# Patient Record
Sex: Female | Born: 1985 | Race: Black or African American | Hispanic: No | Marital: Single | Smoking: Current every day smoker
Health system: Southern US, Community
[De-identification: ages and names within clinical notes are randomized; demographics above are authoritative.]

## PROBLEM LIST (undated history)

## (undated) ENCOUNTER — Ambulatory Visit: Admission: EM | Payer: 59 | Source: Home / Self Care

## (undated) ENCOUNTER — Ambulatory Visit: Admission: EM | Payer: 59

## (undated) DIAGNOSIS — F419 Anxiety disorder, unspecified: Secondary | ICD-10-CM

## (undated) DIAGNOSIS — K219 Gastro-esophageal reflux disease without esophagitis: Secondary | ICD-10-CM

## (undated) DIAGNOSIS — T7840XA Allergy, unspecified, initial encounter: Secondary | ICD-10-CM

## (undated) DIAGNOSIS — I10 Essential (primary) hypertension: Secondary | ICD-10-CM

## (undated) HISTORY — DX: Gastro-esophageal reflux disease without esophagitis: K21.9

## (undated) HISTORY — DX: Allergy, unspecified, initial encounter: T78.40XA

## (undated) HISTORY — DX: Essential (primary) hypertension: I10

## (undated) HISTORY — DX: Anxiety disorder, unspecified: F41.9

---
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
## 2005-06-02 ENCOUNTER — Emergency Department (HOSPITAL_COMMUNITY): Admission: EM | Admit: 2005-06-02 | Discharge: 2005-06-02 | Payer: Self-pay | Admitting: Emergency Medicine

## 2005-07-19 ENCOUNTER — Emergency Department (HOSPITAL_COMMUNITY): Admission: EM | Admit: 2005-07-19 | Discharge: 2005-07-19 | Payer: Self-pay | Admitting: Emergency Medicine

## 2005-07-31 ENCOUNTER — Emergency Department (HOSPITAL_COMMUNITY): Admission: EM | Admit: 2005-07-31 | Discharge: 2005-07-31 | Payer: Self-pay | Admitting: *Deleted

## 2006-10-24 ENCOUNTER — Emergency Department (HOSPITAL_COMMUNITY): Admission: EM | Admit: 2006-10-24 | Discharge: 2006-10-24 | Payer: Self-pay | Admitting: Emergency Medicine

## 2008-02-21 ENCOUNTER — Emergency Department (HOSPITAL_COMMUNITY): Admission: EM | Admit: 2008-02-21 | Discharge: 2008-02-21 | Payer: Self-pay | Admitting: Emergency Medicine

## 2008-11-08 ENCOUNTER — Emergency Department (HOSPITAL_COMMUNITY): Admission: EM | Admit: 2008-11-08 | Discharge: 2008-11-08 | Payer: Self-pay | Admitting: Emergency Medicine

## 2009-09-06 ENCOUNTER — Emergency Department (HOSPITAL_COMMUNITY): Admission: EM | Admit: 2009-09-06 | Discharge: 2009-09-06 | Payer: Self-pay | Admitting: Emergency Medicine

## 2010-05-02 ENCOUNTER — Emergency Department (HOSPITAL_COMMUNITY)
Admission: EM | Admit: 2010-05-02 | Discharge: 2010-05-02 | Payer: Self-pay | Source: Home / Self Care | Admitting: Emergency Medicine

## 2010-07-08 ENCOUNTER — Emergency Department (HOSPITAL_COMMUNITY): Payer: Self-pay

## 2010-07-08 ENCOUNTER — Emergency Department (HOSPITAL_COMMUNITY)
Admission: EM | Admit: 2010-07-08 | Discharge: 2010-07-09 | Disposition: A | Discharge status: Home / Self Care | Payer: Self-pay | Attending: Emergency Medicine | Admitting: Emergency Medicine

## 2010-07-08 DIAGNOSIS — R0609 Other forms of dyspnea: Secondary | ICD-10-CM | POA: Insufficient documentation

## 2010-07-08 DIAGNOSIS — R0602 Shortness of breath: Secondary | ICD-10-CM | POA: Insufficient documentation

## 2010-07-08 DIAGNOSIS — R0789 Other chest pain: Secondary | ICD-10-CM | POA: Insufficient documentation

## 2010-07-08 DIAGNOSIS — J45909 Unspecified asthma, uncomplicated: Secondary | ICD-10-CM | POA: Insufficient documentation

## 2010-07-08 DIAGNOSIS — R0989 Other specified symptoms and signs involving the circulatory and respiratory systems: Secondary | ICD-10-CM | POA: Insufficient documentation

## 2010-07-08 LAB — URINALYSIS, ROUTINE W REFLEX MICROSCOPIC
Bilirubin Urine: NEGATIVE
Glucose, UA: NEGATIVE mg/dL
Hgb urine dipstick: NEGATIVE
Ketones, ur: NEGATIVE mg/dL
Nitrite: NEGATIVE
Protein, ur: NEGATIVE mg/dL
Specific Gravity, Urine: 1.028 (ref 1.005–1.030)
Urobilinogen, UA: 1 mg/dL (ref 0.0–1.0)
pH: 7 (ref 5.0–8.0)

## 2010-07-08 LAB — CBC
RBC: 4.73 MIL/uL (ref 3.87–5.11)
WBC: 6.1 10*3/uL (ref 4.0–10.5)

## 2010-07-08 LAB — COMPREHENSIVE METABOLIC PANEL
ALT: 33 U/L (ref 0–35)
AST: 34 U/L (ref 0–37)
Alkaline Phosphatase: 70 U/L (ref 39–117)
CO2: 27 mEq/L (ref 19–32)
Calcium: 9 mg/dL (ref 8.4–10.5)
Chloride: 101 mEq/L (ref 96–112)
GFR calc Af Amer: >60 mL/min (ref >60)
GFR calc non Af Amer: >60 mL/min (ref >60)
Potassium: 3.6 mEq/L (ref 3.5–5.1)
Sodium: 135 mEq/L (ref 135–145)
Total Bilirubin: 0.5 mg/dL (ref 0.3–1.2)

## 2010-07-08 LAB — DIFFERENTIAL
Basophils Absolute: 0 10*3/uL (ref 0.0–0.1)
Basophils Relative: 1 % (ref 0–1)
Lymphocytes Relative: 42 % (ref 12–46)
Monocytes Absolute: 0.5 10*3/uL (ref 0.1–1.0)
Monocytes Relative: 8 % (ref 3–12)
Neutro Abs: 2.8 10*3/uL (ref 1.7–7.7)

## 2010-07-08 LAB — URINE MICROSCOPIC-ADD ON

## 2010-07-08 LAB — GC/CHLAMYDIA PROBE AMP, GENITAL: Chlamydia, DNA Probe: NEGATIVE

## 2010-07-08 LAB — WET PREP, GENITAL
Trich, Wet Prep: NONE SEEN
Yeast Wet Prep HPF POC: NONE SEEN

## 2010-07-08 LAB — POCT PREGNANCY, URINE: Preg Test, Ur: NEGATIVE

## 2011-07-13 ENCOUNTER — Emergency Department (HOSPITAL_COMMUNITY)
Admission: EM | Admit: 2011-07-13 | Discharge: 2011-07-13 | Disposition: A | Discharge status: Home / Self Care | Payer: Self-pay | Source: Home / Self Care | Attending: Emergency Medicine | Admitting: Emergency Medicine

## 2011-07-13 ENCOUNTER — Encounter (HOSPITAL_COMMUNITY): Payer: Self-pay

## 2011-07-13 DIAGNOSIS — J329 Chronic sinusitis, unspecified: Secondary | ICD-10-CM

## 2011-07-13 DIAGNOSIS — J3489 Other specified disorders of nose and nasal sinuses: Secondary | ICD-10-CM

## 2011-07-13 MED ORDER — ACETAMINOPHEN-CODEINE #3 300-30 MG PO TABS: 1.0000 | ORAL_TABLET | ORAL | Status: AC | PRN

## 2011-07-13 MED ORDER — SODIUM CHLORIDE 0.65 % NA SOLN: 1.0000 | NASAL | Status: DC | PRN

## 2011-07-13 MED ORDER — FEXOFENADINE-PSEUDOEPHED ER 60-120 MG PO TB12: 1.0000 | ORAL_TABLET | Freq: Two times a day (BID) | ORAL | Status: AC

## 2011-07-13 NOTE — ED Notes (Signed)
C/o sinus /head congestion, pressure on lt side of head, and runny nose for 2 days.  Thinks she has a sinus infection.

## 2011-07-13 NOTE — Discharge Instructions (Signed)
  Has discuss her constant nasal and runny nose along with her sinus pressure are consistent with a viral upper respiratory process. Use this medicines as prescribed and hydrate yourself well as well as a humidifier usage at night. If symptoms were to persist beyond 7-10 days return for recheck   Sinusitis Sinuses are air pockets within the bones of your face. The growth of bacteria within a sinus leads to infection. The infection prevents the sinuses from draining. This infection is called sinusitis. SYMPTOMS  There will be different areas of pain depending on which sinuses have become infected.  The maxillary sinuses often produce pain beneath the eyes.   Frontal sinusitis may cause pain in the middle of the forehead and above the eyes.  Other problems (symptoms) include:  Toothaches.   Colored, pus-like (purulent) drainage from the nose.   Swelling, warmth, and tenderness over the sinus areas may be signs of infection.  TREATMENT  Sinusitis is most often determined by an exam.X-rays may be taken. If x-rays have been taken, make sure you obtain your results or find out how you are to obtain them. Your caregiver may give you medications (antibiotics). These are medications that will help kill the bacteria causing the infection. You may also be given a medication (decongestant) that helps to reduce sinus swelling.  HOME CARE INSTRUCTIONS   Only take over-the-counter or prescription medicines for pain, discomfort, or fever as directed by your caregiver.   Drink extra fluids. Fluids help thin the mucus so your sinuses can drain more easily.   Applying either moist heat or ice packs to the sinus areas may help relieve discomfort.   Use saline nasal sprays to help moisten your sinuses. The sprays can be found at your local drugstore.  SEEK IMMEDIATE MEDICAL CARE IF:  You have a fever.   You have increasing pain, severe headaches, or toothache.   You have nausea, vomiting, or  drowsiness.   You develop unusual swelling around the face or trouble seeing.  MAKE SURE YOU:   Understand these instructions.   Will watch your condition.   Will get help right away if you are not doing well or get worse.  Document Released: 03/19/2005 Document Revised: 03/08/2011 Document Reviewed: 10/16/2006 [facility] Patient Information 2012 [place], [place].

## 2011-07-13 NOTE — ED Provider Notes (Signed)
History     CSN: [id number]  Arrival date & time 07/13/11  1404   First MD Initiated Contact with Patient 07/13/11 1405      Chief Complaint  Patient presents with  . Sinusitis    (Consider location/radiation/quality/duration/timing/severity/associated sxs/prior treatment) HPI Comments: Patient presents urgent care complaining of sinus congestion, stuffy and runny nose with some postnasal dripping and sinus pressure mainly on the left side of her head (points to left frontal and maxillary regions) patient describes that she has a cousin at home but was diagnosed last with an upper respiratory infection. He has been using Zyrtec and also normal saline spray as she has a lot of runny and congested nose don't seem to be helping her much.  Patient denies any fevers or shortness of breath and occasionally cough is induced by postnasal dripping  Patient is a 26 y.o. female presenting with sinusitis. The history is provided by the patient.  Sinusitis  This is a new problem. The current episode started 2 days ago. The problem has not changed since onset.There has been no fever. Associated symptoms include congestion, ear pain, sinus pressure, sore throat and cough. Pertinent negatives include no swollen glands and no shortness of breath. Treatments tried: zyrtec-saline nasal spray. The treatment provided no relief.    Past Medical History  Diagnosis Date  . Asthma     History reviewed. No pertinent past surgical history.  No family history on file.  History  Substance Use Topics  . Smoking status: Current Everyday Smoker -- 1.0 packs/day  . Smokeless tobacco: Not on file  . Alcohol Use: No    OB History    Grav Para Term Preterm Abortions TAB SAB Ect Mult Living                  Review of Systems  Constitutional: Negative for fever and appetite change.  HENT: Positive for ear pain, congestion, sore throat, rhinorrhea, postnasal drip and sinus pressure.   Respiratory: Positive  for cough. Negative for shortness of breath, wheezing and stridor.   Skin: Negative for rash.    Allergies  Penicillins  Home Medications   Current Outpatient Rx  Name Route Sig Dispense Refill  . CETIRIZINE HCL 10 MG PO TABS Oral Take 10 mg by mouth daily.      BP 127/92  Pulse 111  Temp(Src) 98.2 F (36.8 C) (Oral)  Resp 18  SpO2 99%  LMP 07/02/2011  Physical Exam  Nursing note and vitals reviewed. Constitutional: Vital signs are normal.  Non-toxic appearance. She does not have a sickly appearance. She does not appear ill. No distress.  HENT:  Head: Normocephalic.  Right Ear: Tympanic membrane normal. No mastoid tenderness. Tympanic membrane is not injected. No middle ear effusion. No hemotympanum.  Left Ear: Tympanic membrane normal. No mastoid tenderness. Tympanic membrane is not injected.  No middle ear effusion. No hemotympanum.  Nose: Rhinorrhea present.  Mouth/Throat: Uvula is midline and mucous membranes are normal. Posterior oropharyngeal erythema present. No oropharyngeal exudate.  Eyes: Conjunctivae are normal. Right eye exhibits no discharge. Left eye exhibits no discharge.  Neck: Trachea normal. Neck supple. No JVD present. No erythema present.  Pulmonary/Chest: Effort normal and breath sounds normal. No respiratory distress. She has no decreased breath sounds. She has no wheezes.  Lymphadenopathy:    She has no cervical adenopathy.  Neurological: She is alert.  Skin: Skin is warm. No rash noted. No erythema. No pallor.    ED Course  Procedures (  including critical care time)  Labs Reviewed - No data to display No results found.   No diagnosis found.    MDM  Sinus congestion with marked rhinorrhea. Afebrile no respiratory distress for 48 hours. Encouraged patient to take antihistamine along with the congestion as prescribed continue normal saline sprays and codeine prescribed for pressure and discomfort. To return in 7-10 days if symptoms were to  persist or worsens        Jimmie Molly, MD 07/13/11 1450

## 2012-07-05 ENCOUNTER — Encounter (HOSPITAL_COMMUNITY): Payer: Self-pay | Admitting: Emergency Medicine

## 2012-07-05 ENCOUNTER — Emergency Department (INDEPENDENT_AMBULATORY_CARE_PROVIDER_SITE_OTHER)
Admission: EM | Admit: 2012-07-05 | Discharge: 2012-07-05 | Disposition: A | Discharge status: Home / Self Care | Payer: 59 | Source: Home / Self Care | Attending: Emergency Medicine | Admitting: Emergency Medicine

## 2012-07-05 DIAGNOSIS — J40 Bronchitis, not specified as acute or chronic: Secondary | ICD-10-CM

## 2012-07-05 MED ORDER — AZITHROMYCIN 250 MG PO TABS: ORAL_TABLET | ORAL | Status: DC

## 2012-07-05 MED ORDER — HYDROCOD POLST-CHLORPHEN POLST 10-8 MG/5ML PO LQCR: 5.0000 mL | Freq: Two times a day (BID) | ORAL | Status: DC | PRN

## 2012-07-05 MED ORDER — ALBUTEROL SULFATE HFA 108 (90 BASE) MCG/ACT IN AERS: 1.0000 | INHALATION_SPRAY | RESPIRATORY_TRACT | Status: DC | PRN

## 2012-07-05 NOTE — ED Provider Notes (Signed)
History     CSN: [id number]  Arrival date & time 07/05/12  1328   First MD Initiated Contact with Patient 07/05/12 1414      Chief Complaint  Patient presents with  . Facial Pain    sinus congestion. productive cough with green mucus. sob. wheezing     Patient is a 27 y.o. female presenting with cough. The history is provided by the patient.  Cough Cough characteristics:  Productive Sputum characteristics:  Green Severity:  Moderate Onset quality:  Gradual Duration:  8 days Timing:  Intermittent Progression:  Unchanged Chronicity:  New Smoker: yes   Context: smoke exposure and upper respiratory infection   Context: not animal exposure, not exposure to allergens, not fumes, not occupational exposure, not sick contacts, not weather changes and not with activity   Relieved by:  Beta-agonist inhaler Worsened by:  Lying down Associated symptoms: rhinorrhea, sinus congestion and sore throat   Associated symptoms: no chest pain, no chills, no diaphoresis, no ear fullness, no ear pain, no eye discharge, no fever, no headaches, no myalgias, no rash, no shortness of breath, no weight loss and no wheezing   Risk factors: no chemical exposure     Past Medical History  Diagnosis Date  . Asthma     History reviewed. No pertinent past surgical history.  History reviewed. No pertinent family history.  History  Substance Use Topics  . Smoking status: Current Every Day Smoker -- 1.00 packs/day  . Smokeless tobacco: Not on file  . Alcohol Use: No    OB History   Grav Para Term Preterm Abortions TAB SAB Ect Mult Living                  Review of Systems  Constitutional: Negative for fever, chills, weight loss and diaphoresis.  HENT: Positive for sore throat and rhinorrhea. Negative for ear pain.   Eyes: Negative.  Negative for discharge.  Respiratory: Positive for cough. Negative for shortness of breath and wheezing.   Cardiovascular: Negative for chest pain.   Gastrointestinal: Negative for nausea, vomiting, abdominal pain and diarrhea.  Endocrine: Negative.   Genitourinary: Negative.   Musculoskeletal: Negative.  Negative for myalgias.  Skin: Negative.  Negative for rash.  Allergic/Immunologic: Negative.   Neurological: Negative.  Negative for headaches.  Hematological: Negative.   Psychiatric/Behavioral: Negative.   Pt reports onset of sore throat and cough almost 2 weeks ago. The sore throat has improved but the cough persist and is worse at night after lying down. Pt has a h/o asthma and states usually she only uses her inhaler when needed but has been using more often over the last 2 weeks since onset of symptoms and provided temporary relief from coughing. Denies CP, SOB, fever, N/V/D or abd pain. Pt is a smoker.  Allergies  Penicillins  Home Medications   Current Outpatient Rx  Name  Route  Sig  Dispense  Refill  . fexofenadine-pseudoephedrine (ALLEGRA-D) 60-120 MG per tablet   Oral   Take 1 tablet by mouth every 12 (twelve) hours.   30 tablet   0   . sodium chloride (OCEAN) 0.65 % nasal spray   Nasal   Place 1 spray into the nose as needed for congestion.   15 mL   12     BP 123/65  Pulse 87  Temp(Src) 97.9 F (36.6 C)  Resp 16  SpO2 100%  LMP 06/27/2012  Physical Exam  Constitutional: She is oriented to person, place, and time. She  appears well-developed and well-nourished.  HENT:  Head: Normocephalic and atraumatic.  Right Ear: Tympanic membrane, external ear and ear canal normal.  Left Ear: Tympanic membrane, external ear and ear canal normal.  Nose: Nose normal. Right sinus exhibits no maxillary sinus tenderness and no frontal sinus tenderness. Left sinus exhibits no maxillary sinus tenderness and no frontal sinus tenderness.  Mouth/Throat: Uvula is midline.  Mild pharyngeal erythema   Eyes: Conjunctivae are normal.  Neck: Neck supple.  Cardiovascular: Normal rate and regular rhythm.   Pulmonary/Chest:  Effort normal and breath sounds normal.  Musculoskeletal: Normal range of motion.  Neurological: She is alert and oriented to person, place, and time.  Skin: Skin is warm and dry.  Psychiatric: She has a normal mood and affect.    ED Course  Procedures (including critical care time)  Labs Reviewed - No data to display No results found.   No diagnosis found.    MDM  Almost 2 weeks h/o persistent cough. Began with sore throat. States cough is frequently productive of greenish secretions and is worse at night. Some relief of cough w/ inhaler. Denies any fever. Will treat  For Bronchitis w/ Z-Pack, short course of tussionex for nocturnal cough and refill for albuterol inhaler. Pt encouraged to f/u here or w/ PCP at [facility] if not improving or to ED if symptoms suddenly worsen. Pt agreeable.        Leanne Chang, NP 07/05/12 1731

## 2012-07-05 NOTE — ED Notes (Signed)
Reports: sinus congestion. Productive cough with green mucus. Sob. Wheezing.  Pt states that she has a hx of asthma and has been using inhaler with mild relief.  Denies fever, n/v/d. Has taken otc sinus meds with no relief

## 2012-07-05 NOTE — ED Provider Notes (Signed)
Medical screening examination/treatment/procedure(s) were performed by non-physician practitioner and as supervising physician I was immediately available for consultation/collaboration.  Leslee Home, M.D.  Reuben Likes, MD 07/05/12 [phone]

## 2012-11-11 IMAGING — CR DG CHEST 2V
2 series · 2 of 2 positions shown · non-contrast
Comparison: Chest radiograph performed 09/06/2009

CLINICAL DATA: Sternal chest pain and shortness of breath; history
of asthma and bronchitis.  History of smoking.

CHEST - 2 VIEW

[w chest pa]
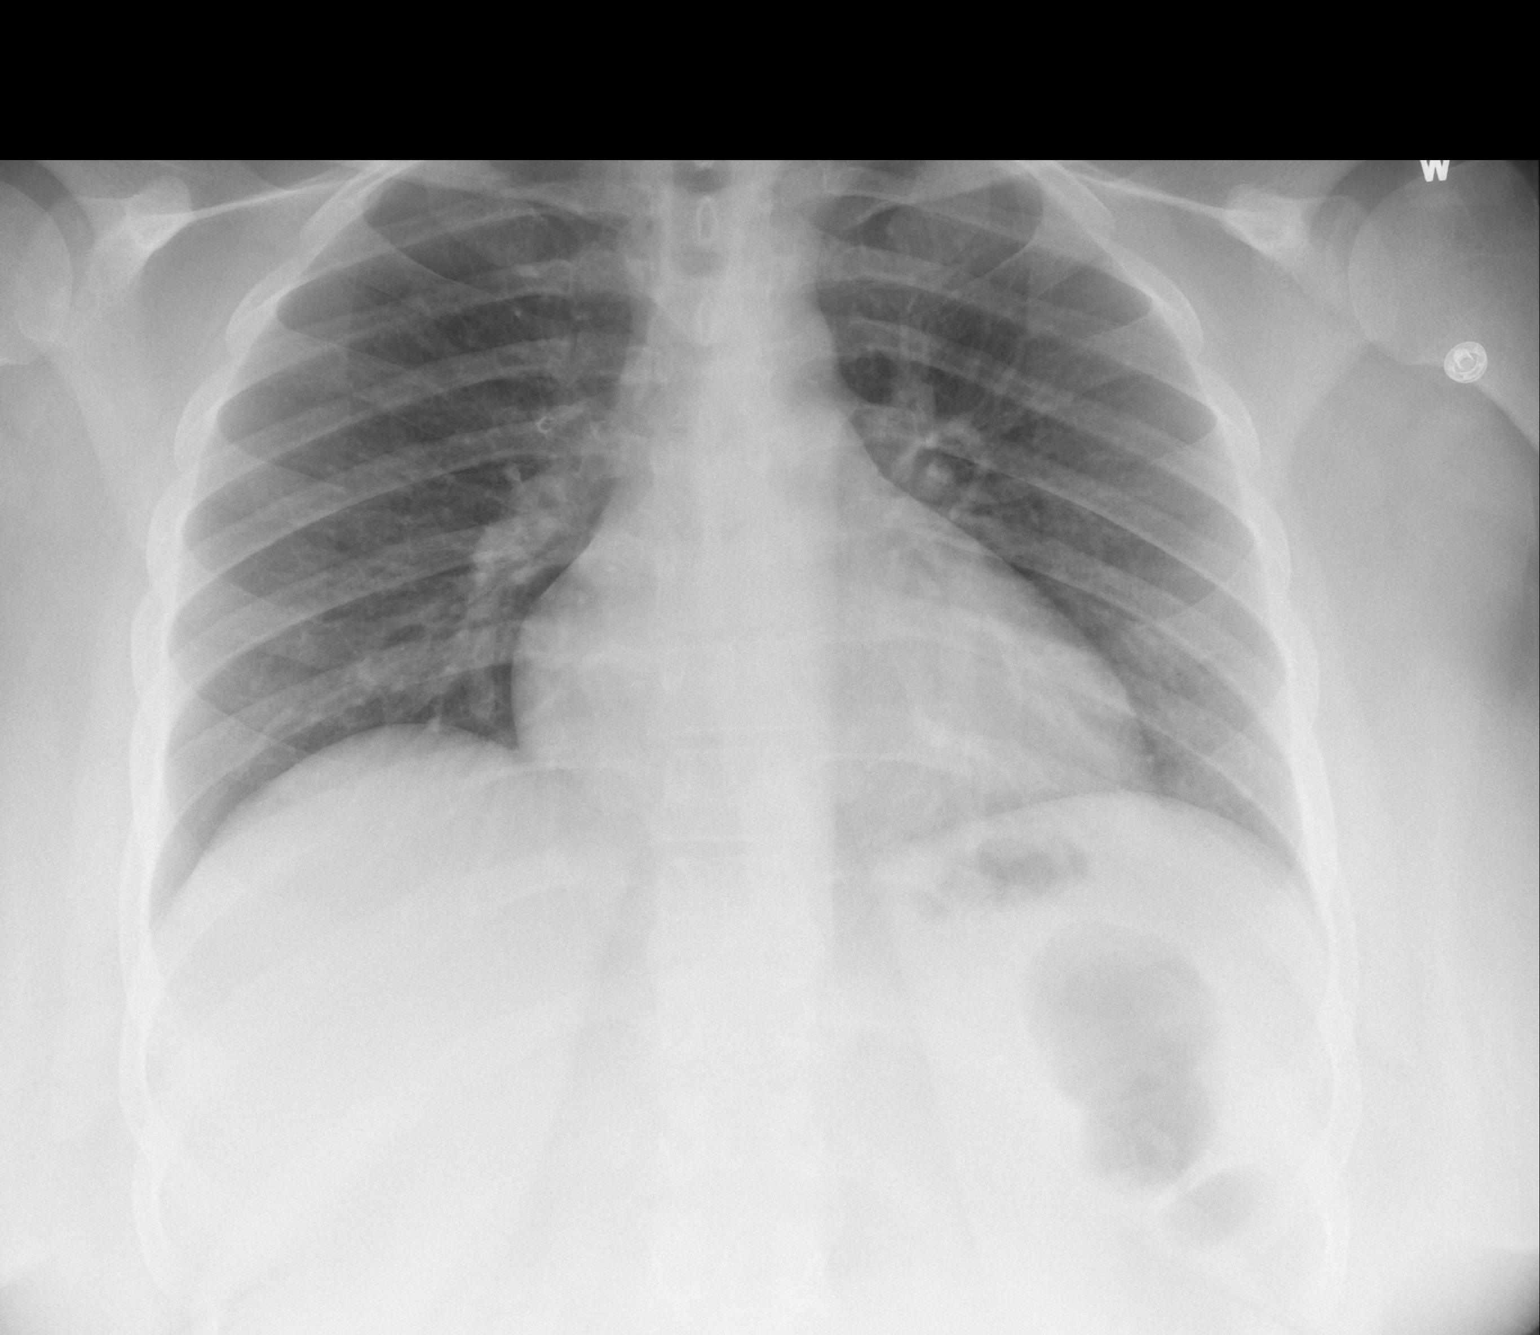

[w chest lat *]
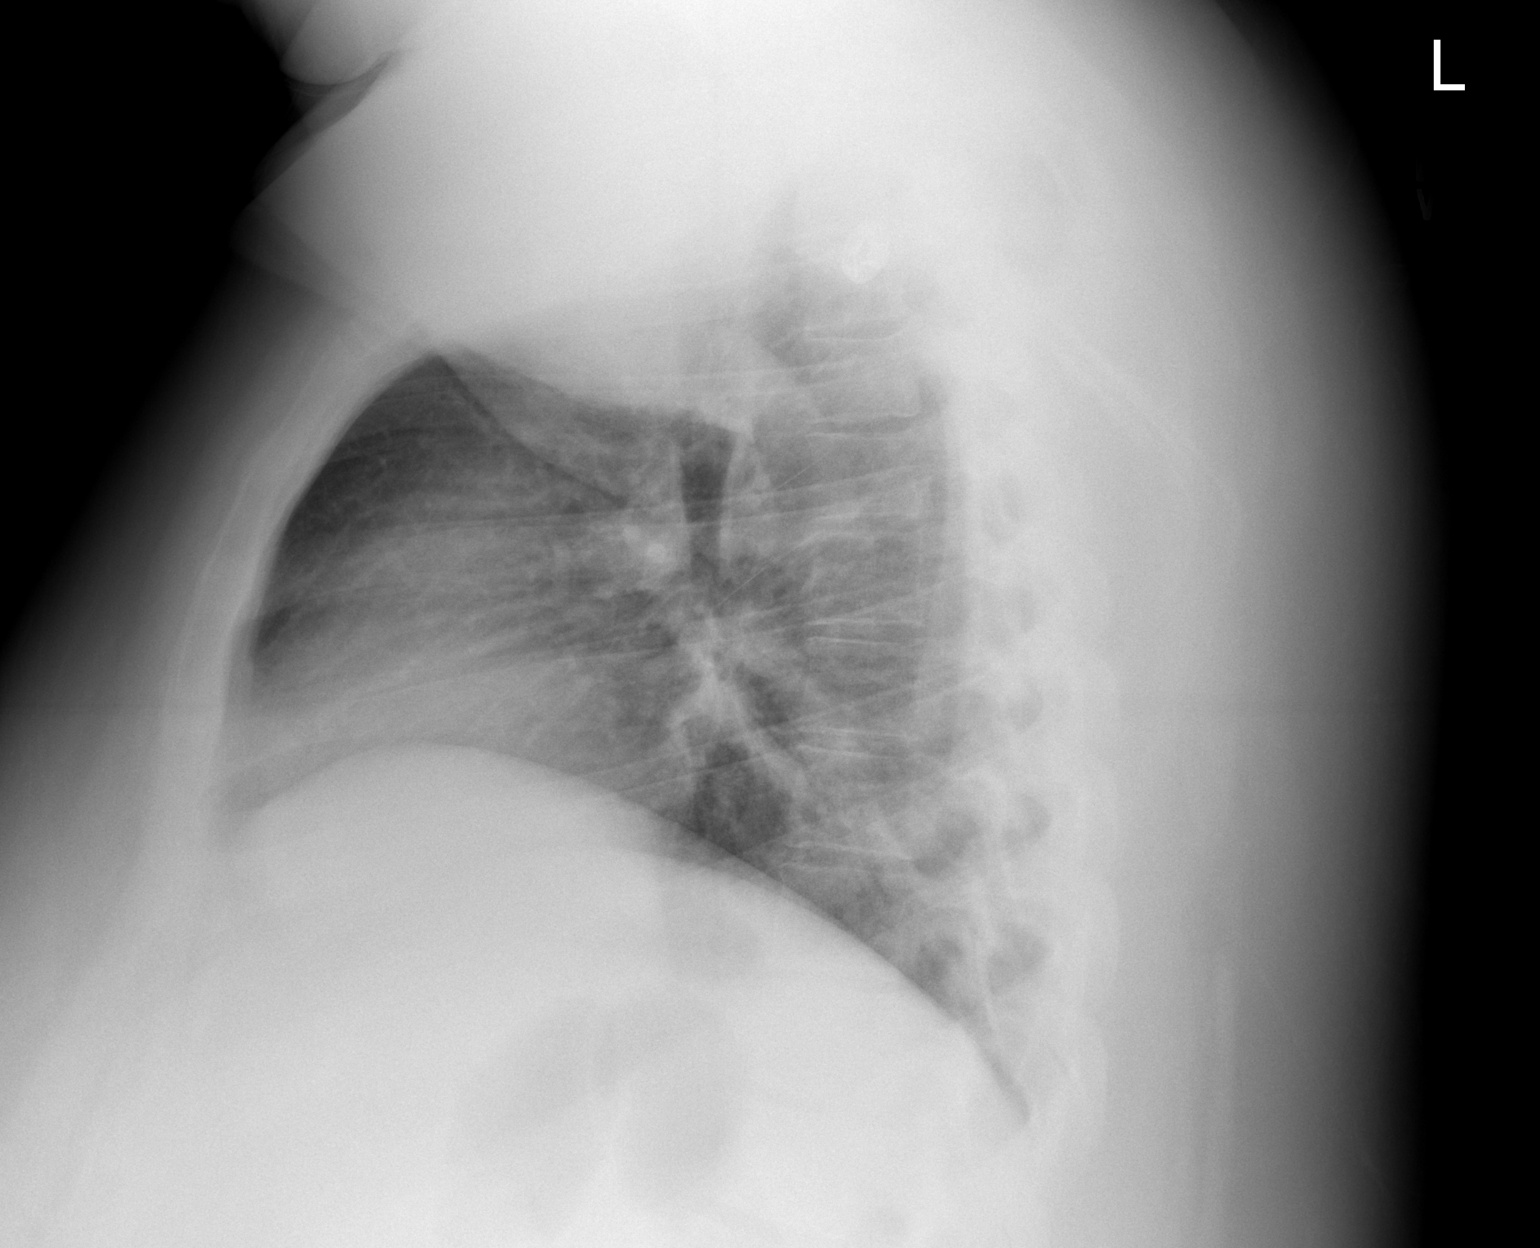

[2 of 2 positions shown; findings below may reference images not displayed]

FINDINGS: The lungs are relatively well-aerated and clear.  There
is no evidence of focal opacification, pleural effusion or
pneumothorax.

The heart is normal in size; the mediastinal contour is within
normal limits.  No acute osseous abnormalities are seen.
IMPRESSION: No acute cardiopulmonary process seen.

## 2013-03-05 ENCOUNTER — Encounter (HOSPITAL_COMMUNITY): Payer: Self-pay | Admitting: Emergency Medicine

## 2013-03-05 ENCOUNTER — Emergency Department (INDEPENDENT_AMBULATORY_CARE_PROVIDER_SITE_OTHER)
Admission: EM | Admit: 2013-03-05 | Discharge: 2013-03-05 | Disposition: A | Discharge status: Home / Self Care | Payer: Self-pay | Source: Home / Self Care

## 2013-03-05 DIAGNOSIS — L253 Unspecified contact dermatitis due to other chemical products: Secondary | ICD-10-CM

## 2013-03-05 DIAGNOSIS — L235 Allergic contact dermatitis due to other chemical products: Secondary | ICD-10-CM

## 2013-03-05 MED ORDER — TRIAMCINOLONE ACETONIDE 0.1 % EX CREA: 1.0000 "application " | TOPICAL_CREAM | Freq: Two times a day (BID) | CUTANEOUS | Status: DC

## 2013-03-05 NOTE — ED Provider Notes (Signed)
CSN: [id number]     Arrival date & time 03/05/13  1036 History   First MD Initiated Contact with Patient 03/05/13 1219     Chief Complaint  Patient presents with  . Rash   (Consider location/radiation/quality/duration/timing/severity/associated sxs/prior Treatment) HPI Comments: 27 year old obese female complaining of the rash to the left wrist. She received a new tattoo to the extensor surface of the left wrist approximately one month ago. 2 weeks later she developed an itchy papular vesicular rash along the lines. She is applied various OTC ointments and creams to include cord 810. Then he seemed to work.   Past Medical History  Diagnosis Date  . Asthma    History reviewed. No pertinent past surgical history. History reviewed. No pertinent family history. History  Substance Use Topics  . Smoking status: Current Every Day Smoker -- 1.00 packs/day  . Smokeless tobacco: Not on file  . Alcohol Use: No   OB History   Grav Para Term Preterm Abortions TAB SAB Ect Mult Living                 Review of Systems  Constitutional: Negative.   Respiratory: Negative.   Gastrointestinal: Negative.   Skin: Positive for rash.    Allergies  Penicillins  Home Medications   Current Outpatient Rx  Name  Route  Sig  Dispense  Refill  . albuterol (PROVENTIL HFA;VENTOLIN HFA) 108 (90 BASE) MCG/ACT inhaler   Inhalation   Inhale 1-2 puffs into the lungs every 4 (four) hours as needed for wheezing or shortness of breath (and/or cough).   1 Inhaler   0   . azithromycin (ZITHROMAX Z-PAK) 250 MG tablet      Take 2 tabs on day 1 and then 1 tab daily on days 2-5   6 tablet   0   . chlorpheniramine-HYDROcodone (TUSSIONEX PENNKINETIC ER) 10-8 MG/5ML LQCR   Oral   Take 5 mLs by mouth every 12 (twelve) hours as needed.   50 mL   0   . EXPIRED: sodium chloride (OCEAN) 0.65 % nasal spray   Nasal   Place 1 spray into the nose as needed for congestion.   15 mL   12   . triamcinolone cream  (KENALOG) 0.1 %   Topical   Apply 1 application topically 2 (two) times daily.   30 g   0    BP 123/70  Pulse 99  Temp(Src) 98.5 F (36.9 C) (Oral)  Resp 16  SpO2 100%  LMP 02/16/2013 Physical Exam  Nursing note and vitals reviewed. Constitutional: She appears well-developed and well-nourished. No distress.  Pulmonary/Chest: Effort normal. No respiratory distress.  Musculoskeletal: She exhibits no edema.  Neurological: She is alert. She exhibits normal muscle tone.  Skin: Skin is warm and dry.  Papulovesicular rash along the lines of her recent tattoo of the left wrist. No erythema, drainage or signs of infection.  Psychiatric: She has a normal mood and affect.    ED Course  Procedures (including critical care time) Labs Review Labs Reviewed - No data to display Imaging Review No results found.     MDM   1. Chemical induced allergic contact dermatitis      Triamcinolone cream bid Benadryl cream bid prn  Hayden Rasmussen, NP 03/05/13 1235

## 2013-03-05 NOTE — ED Notes (Signed)
C/o rash on left hand  States she got a tattoo on Nov. 15 and two weeks after that her hand broke out cortisone cream and a&d ointment used but no relief.

## 2013-03-06 NOTE — ED Provider Notes (Signed)
Medical screening examination/treatment/procedure(s) were performed by a resident physician or non-physician practitioner and as the supervising physician I was immediately available for consultation/collaboration.  Evan Corey, MD    Evan S Corey, MD 03/06/13 0812 

## 2013-10-02 ENCOUNTER — Emergency Department (INDEPENDENT_AMBULATORY_CARE_PROVIDER_SITE_OTHER)
Admission: EM | Admit: 2013-10-02 | Discharge: 2013-10-02 | Disposition: A | Discharge status: Home / Self Care | Payer: BC Managed Care – PPO | Source: Home / Self Care | Attending: Family Medicine | Admitting: Family Medicine

## 2013-10-02 ENCOUNTER — Encounter (HOSPITAL_COMMUNITY): Payer: Self-pay | Admitting: Emergency Medicine

## 2013-10-02 DIAGNOSIS — I889 Nonspecific lymphadenitis, unspecified: Secondary | ICD-10-CM

## 2013-10-02 MED ORDER — CLINDAMYCIN HCL 300 MG PO CAPS: 300.0000 mg | ORAL_CAPSULE | Freq: Three times a day (TID) | ORAL | Status: DC

## 2013-10-02 NOTE — ED Provider Notes (Signed)
CSN: [id number]     Arrival date & time 10/02/13  [id number] History   First MD Initiated Contact with Patient 10/02/13 1007     Chief Complaint  Patient presents with  . Facial Swelling   (Consider location/radiation/quality/duration/timing/severity/associated sxs/prior Treatment) Patient is a 28 y.o. female presenting with pharyngitis. The history is provided by the patient.  Sore Throat This is a new problem. The current episode started yesterday (knot felt in neck and side of jaw yest , worse today.with swelling). The problem has been gradually worsening. Pertinent negatives include no chest pain, no abdominal pain and no shortness of breath. The symptoms are aggravated by swallowing.    Past Medical History  Diagnosis Date  . Asthma    History reviewed. No pertinent past surgical history. History reviewed. No pertinent family history. History  Substance Use Topics  . Smoking status: Current Every Day Smoker -- 1.00 packs/day  . Smokeless tobacco: Not on file  . Alcohol Use: No   OB History   Grav Para Term Preterm Abortions TAB SAB Ect Mult Living                 Review of Systems  Constitutional: Negative.   HENT: Negative for dental problem, ear pain, mouth sores, sore throat, tinnitus and trouble swallowing.   Respiratory: Negative for shortness of breath.   Cardiovascular: Negative for chest pain.  Gastrointestinal: Negative for abdominal pain.  Musculoskeletal: Positive for neck pain.    Allergies  Penicillins  Home Medications   Prior to Admission medications   Medication Sig Start Date End Date Taking? Authorizing Provider  albuterol (PROVENTIL HFA;VENTOLIN HFA) 108 (90 BASE) MCG/ACT inhaler Inhale 1-2 puffs into the lungs every 4 (four) hours as needed for wheezing or shortness of breath (and/or cough). 07/05/12   Roma KayserKatherine P Schorr, NP  azithromycin (ZITHROMAX Z-PAK) 250 MG tablet Take 2 tabs on day 1 and then 1 tab daily on days 2-5 07/05/12   Roma KayserKatherine P Schorr, NP   chlorpheniramine-HYDROcodone (TUSSIONEX PENNKINETIC ER) 10-8 MG/5ML LQCR Take 5 mLs by mouth every 12 (twelve) hours as needed. 07/05/12   Roma KayserKatherine P Schorr, NP  clindamycin (CLEOCIN) 300 MG capsule Take 1 capsule (300 mg total) by mouth 3 (three) times daily. 10/02/13   Linna HoffJames D Miyana Mordecai, MD  sodium chloride (OCEAN) 0.65 % nasal spray Place 1 spray into the nose as needed for congestion. 07/13/11 07/12/12  Jimmie MollyPaolo Coll, MD  triamcinolone cream (KENALOG) 0.1 % Apply 1 application topically 2 (two) times daily. 03/05/13   Hayden Rasmussenavid Mabe, NP   BP 117/76  Pulse 96  Temp(Src) 98.6 F (37 C) (Oral)  Resp 16  SpO2 100%  LMP 09/29/2013 Physical Exam  Nursing note and vitals reviewed. Constitutional: She is oriented to person, place, and time. She appears well-developed and well-nourished.  HENT:  Head: Normocephalic.  Right Ear: External ear normal.  Left Ear: External ear normal.  Mouth/Throat: Oropharynx is clear and moist.  Eyes: Conjunctivae are normal. Pupils are equal, round, and reactive to light.  Neck: Normal range of motion. Neck supple.    Lymphadenopathy:    She has cervical adenopathy.  Neurological: She is alert and oriented to person, place, and time.  Skin: Skin is warm and dry.    ED Course  Procedures (including critical care time) Labs Review Labs Reviewed - No data to display  Imaging Review No results found.   MDM   1. Cervical lymphadenitis        Quita SkyeJames D  Kytzia Gienger, MD 10/02/13 1030

## 2013-10-02 NOTE — ED Notes (Signed)
Pt  Has  Some  Swelling  Under her  Chin  As  Well as  The  l   Side of  Her neck  With  Onset  Of  Symptoms       Yesterday        Pt has  Some  Pain on palpation  She  denys  Any sorethroat or  Any type of earache      She  Is  Sitting  Upright on  Exam table  Speaking in  Complete  sentances  And is in no  Distress

## 2013-10-02 NOTE — Discharge Instructions (Signed)
Take all of medicine as prescribed, return on mon if further problems.

## 2014-03-14 ENCOUNTER — Encounter (HOSPITAL_COMMUNITY): Payer: Self-pay

## 2014-03-14 ENCOUNTER — Emergency Department (HOSPITAL_COMMUNITY)
Admission: EM | Admit: 2014-03-14 | Discharge: 2014-03-14 | Disposition: A | Discharge status: Home / Self Care | Payer: BC Managed Care – PPO | Attending: Emergency Medicine | Admitting: Emergency Medicine

## 2014-03-14 DIAGNOSIS — Z7952 Long term (current) use of systemic steroids: Secondary | ICD-10-CM | POA: Diagnosis not present

## 2014-03-14 DIAGNOSIS — Z720 Tobacco use: Secondary | ICD-10-CM | POA: Diagnosis not present

## 2014-03-14 DIAGNOSIS — Z792 Long term (current) use of antibiotics: Secondary | ICD-10-CM | POA: Diagnosis not present

## 2014-03-14 DIAGNOSIS — Z79899 Other long term (current) drug therapy: Secondary | ICD-10-CM | POA: Diagnosis not present

## 2014-03-14 DIAGNOSIS — Z880 Allergy status to penicillin: Secondary | ICD-10-CM | POA: Diagnosis not present

## 2014-03-14 DIAGNOSIS — J45901 Unspecified asthma with (acute) exacerbation: Secondary | ICD-10-CM | POA: Insufficient documentation

## 2014-03-14 DIAGNOSIS — F410 Panic disorder [episodic paroxysmal anxiety] without agoraphobia: Secondary | ICD-10-CM | POA: Diagnosis not present

## 2014-03-14 DIAGNOSIS — R0789 Other chest pain: Secondary | ICD-10-CM | POA: Insufficient documentation

## 2014-03-14 MED ORDER — LORAZEPAM 1 MG PO TABS: 1.0000 mg | ORAL_TABLET | Freq: Three times a day (TID) | ORAL | Status: DC | PRN

## 2014-03-14 NOTE — Discharge Instructions (Signed)

## 2014-03-14 NOTE — ED Notes (Signed)
Bed: [facility] Expected date:  Expected time:  Means of arrival:  Comments: Anxiety

## 2014-03-14 NOTE — ED Provider Notes (Signed)
CSN: [id number]     Arrival date & time 03/14/14  [id number] History   First MD Initiated Contact with Patient 03/14/14 1020     Chief Complaint  Patient presents with  . Panic Attack     (Consider location/radiation/quality/duration/timing/severity/associated sxs/prior Treatment) The history is provided by the patient.   patient was arguing today and developed shortness of breath. States that she felt as if her asthma is flaring up. She took her inhaler and then began to have panic attack. She's had a few episodes of these recently. She states she been dealing with a lot of issues at home. She has several family members have recently died.   no depression suicidal or homicidal thoughts. She had previously been seen for panic attacks have been on medication from her PCP, but states that she didn't like it. No fevers. She feels much better now. Dull chest pain. She denies possibility of pregnancy Past Medical History  Diagnosis Date  . Asthma    No past surgical history on file. No family history on file. History  Substance Use Topics  . Smoking status: Current Every Day Smoker -- 1.00 packs/day  . Smokeless tobacco: Not on file  . Alcohol Use: No   OB History    No data available     Review of Systems  Constitutional: Negative for appetite change and fatigue.  Respiratory: Positive for shortness of breath.   Cardiovascular: Positive for chest pain.  Gastrointestinal: Negative for abdominal pain.  Genitourinary: Negative for hematuria.  Skin: Negative for wound.  Psychiatric/Behavioral: The patient is nervous/anxious.       Allergies  Penicillins  Home Medications   Prior to Admission medications   Medication Sig Start Date End Date Taking? Authorizing Provider  albuterol (PROVENTIL HFA;VENTOLIN HFA) 108 (90 BASE) MCG/ACT inhaler Inhale 1-2 puffs into the lungs every 4 (four) hours as needed for wheezing or shortness of breath (and/or cough). 07/05/12  Yes Roma KayserKatherine P Schorr,  NP  ibuprofen (ADVIL,MOTRIN) 200 MG tablet Take 400 mg by mouth every 6 (six) hours as needed for mild pain or moderate pain.   Yes Historical Provider, MD  phentermine (ADIPEX-P) 37.5 MG tablet Take 37.5 mg by mouth daily. 03/12/14  Yes Historical Provider, MD  sodium chloride (OCEAN) 0.65 % SOLN nasal spray Place 1 spray into both nostrils 3 (three) times daily as needed for congestion.   Yes Historical Provider, MD  Vitamin D, Ergocalciferol, (DRISDOL) 50000 UNITS CAPS capsule Take 50,000 Units by mouth once a week. 03/02/14  Yes Historical Provider, MD  azithromycin (ZITHROMAX Z-PAK) 250 MG tablet Take 2 tabs on day 1 and then 1 tab daily on days 2-5 Patient not taking: Reported on 03/14/2014 07/05/12   Roma KayserKatherine P Schorr, NP  chlorpheniramine-HYDROcodone (TUSSIONEX PENNKINETIC ER) 10-8 MG/5ML LQCR Take 5 mLs by mouth every 12 (twelve) hours as needed. Patient not taking: Reported on 03/14/2014 07/05/12   Roma KayserKatherine P Schorr, NP  clindamycin (CLEOCIN) 300 MG capsule Take 1 capsule (300 mg total) by mouth 3 (three) times daily. Patient not taking: Reported on 03/14/2014 10/02/13   Linna HoffJames D Kindl, MD  LORazepam (ATIVAN) 1 MG tablet Take 1 tablet (1 mg total) by mouth 3 (three) times daily as needed for anxiety. 03/14/14   Juliet RudeNathan R. Melva Faux, MD  triamcinolone cream (KENALOG) 0.1 % Apply 1 application topically 2 (two) times daily. Patient not taking: Reported on 03/14/2014 03/05/13   Hayden Rasmussenavid Mabe, NP   BP 121/85 mmHg  Temp(Src) 97.3 F (36.3 C) (  Oral)  Resp 17  SpO2 100%  LMP 03/02/2014 Physical Exam  Constitutional: She appears well-developed and well-nourished.  Eyes: Pupils are equal, round, and reactive to light.  Cardiovascular: Normal rate and regular rhythm.   Pulmonary/Chest: Effort normal and breath sounds normal. She exhibits no tenderness.  Mild anterior chest wall tenderness.  Abdominal: Soft.  Musculoskeletal: She exhibits no edema.  Neurological: She is alert.  Skin: Skin is warm.     ED Course  Procedures (including critical care time) Labs Review Labs Reviewed - No data to display  Imaging Review No results found.   EKG Interpretation None      MDM   Final diagnoses:  Panic attack    patient with shortness of breath. Then progressed to panic attack. Patient appears well now. Has had a lot of social issues at home. We'll give a short course of Ativan. Will give some follow-up resources. Will discharge home.    Juliet RudeNathan R. Rubin PayorPickering, MD 03/14/14 1045

## 2014-03-14 NOTE — ED Notes (Signed)
She reports having to use her asthma inhaler this morning.  Her wheezing responded well to her inhaler (her bbs are now clear), however, it propelled her into a panic attack; the symptoms of which greatly improved during EMS transport to hospital.

## 2015-02-19 ENCOUNTER — Encounter (HOSPITAL_BASED_OUTPATIENT_CLINIC_OR_DEPARTMENT_OTHER): Payer: Self-pay | Admitting: Emergency Medicine

## 2015-02-19 ENCOUNTER — Emergency Department (HOSPITAL_BASED_OUTPATIENT_CLINIC_OR_DEPARTMENT_OTHER)
Admission: EM | Admit: 2015-02-19 | Discharge: 2015-02-19 | Disposition: A | Discharge status: Home / Self Care | Payer: BLUE CROSS/BLUE SHIELD | Attending: Emergency Medicine | Admitting: Emergency Medicine

## 2015-02-19 DIAGNOSIS — Z3202 Encounter for pregnancy test, result negative: Secondary | ICD-10-CM | POA: Insufficient documentation

## 2015-02-19 DIAGNOSIS — J45909 Unspecified asthma, uncomplicated: Secondary | ICD-10-CM | POA: Diagnosis not present

## 2015-02-19 DIAGNOSIS — R102 Pelvic and perineal pain: Secondary | ICD-10-CM

## 2015-02-19 DIAGNOSIS — Z79899 Other long term (current) drug therapy: Secondary | ICD-10-CM | POA: Insufficient documentation

## 2015-02-19 DIAGNOSIS — Z880 Allergy status to penicillin: Secondary | ICD-10-CM | POA: Insufficient documentation

## 2015-02-19 DIAGNOSIS — F17200 Nicotine dependence, unspecified, uncomplicated: Secondary | ICD-10-CM | POA: Diagnosis not present

## 2015-02-19 LAB — URINALYSIS, ROUTINE W REFLEX MICROSCOPIC
Bilirubin Urine: NEGATIVE
GLUCOSE, UA: NEGATIVE mg/dL
KETONES UR: NEGATIVE mg/dL
NITRITE: NEGATIVE
Protein, ur: NEGATIVE mg/dL
Specific Gravity, Urine: 1.021 (ref 1.005–1.030)
pH: 8 (ref 5.0–8.0)

## 2015-02-19 LAB — URINE MICROSCOPIC-ADD ON

## 2015-02-19 LAB — PREGNANCY, URINE: Preg Test, Ur: NEGATIVE

## 2015-02-19 NOTE — ED Notes (Signed)
Patient eloped prior to pelvic exam.

## 2015-02-19 NOTE — ED Provider Notes (Signed)
CSN: [id number]   Arrival date & time 02/19/15 2009  History  By signing my name below, I, Bethel Born, attest that this documentation has been prepared under the direction and in the presence of Tilden Fossa, MD. Electronically Signed: Bethel Born, ED Scribe. 02/19/2015. 10:52 PM.  Chief Complaint  Patient presents with  . Pelvic Pain    HPI The history is provided by the patient. No language interpreter was used.   Katherine Weeks is a 29 y.o. nulligravidous  female who presents to the Emergency Department complaining of new, cramping, 10/10  pelvic pain with gradual onset 2 days ago. Associated symptoms include new brown vaginal discharge with onset yesterday.The discharge is not malodorous and is atypical of her menstrual period.  LNMP was 2 months ago in September. Her menstrual periods are usually regular. She denies dysuria.  Pt has had no new sexual partners. Symptoms are mild, constant.  Past Medical History  Diagnosis Date  . Asthma     History reviewed. No pertinent past surgical history.  History reviewed. No pertinent family history.  Social History  Substance Use Topics  . Smoking status: Current Every Day Smoker -- 1.00 packs/day  . Smokeless tobacco: None  . Alcohol Use: No     Review of Systems  Genitourinary: Positive for vaginal discharge, menstrual problem and pelvic pain. Negative for dysuria and vaginal bleeding.  All other systems reviewed and are negative.  Home Medications   Prior to Admission medications   Medication Sig Start Date End Date Taking? Authorizing Provider  albuterol (PROVENTIL HFA;VENTOLIN HFA) 108 (90 BASE) MCG/ACT inhaler Inhale 1-2 puffs into the lungs every 4 (four) hours as needed for wheezing or shortness of breath (and/or cough). 07/05/12   Roma Kayser Schorr, NP  azithromycin (ZITHROMAX Z-PAK) 250 MG tablet Take 2 tabs on day 1 and then 1 tab daily on days 2-5 Patient not taking: Reported on 03/14/2014 07/05/12   Roma Kayser  Schorr, NP  chlorpheniramine-HYDROcodone (TUSSIONEX PENNKINETIC ER) 10-8 MG/5ML LQCR Take 5 mLs by mouth every 12 (twelve) hours as needed. Patient not taking: Reported on 03/14/2014 07/05/12   Roma Kayser Schorr, NP  clindamycin (CLEOCIN) 300 MG capsule Take 1 capsule (300 mg total) by mouth 3 (three) times daily. Patient not taking: Reported on 03/14/2014 10/02/13   Linna Hoff, MD  ibuprofen (ADVIL,MOTRIN) 200 MG tablet Take 400 mg by mouth every 6 (six) hours as needed for mild pain or moderate pain.    Historical Provider, MD  LORazepam (ATIVAN) 1 MG tablet Take 1 tablet (1 mg total) by mouth 3 (three) times daily as needed for anxiety. 03/14/14   Benjiman Core, MD  phentermine (ADIPEX-P) 37.5 MG tablet Take 37.5 mg by mouth daily. 03/12/14   Historical Provider, MD  sodium chloride (OCEAN) 0.65 % SOLN nasal spray Place 1 spray into both nostrils 3 (three) times daily as needed for congestion.    Historical Provider, MD  triamcinolone cream (KENALOG) 0.1 % Apply 1 application topically 2 (two) times daily. Patient not taking: Reported on 03/14/2014 03/05/13   Hayden Rasmussen, NP  Vitamin D, Ergocalciferol, (DRISDOL) 50000 UNITS CAPS capsule Take 50,000 Units by mouth once a week. 03/02/14   Historical Provider, MD    Allergies  Penicillins  Triage Vitals: BP 121/79 mmHg  Pulse 118  Temp(Src) 98.3 F (36.8 C) (Oral)  Resp 16  Ht  (1.6 m)  Wt 250 lb (113.399 kg)  BMI 44.30 kg/m2  SpO2 100%  LMP 12/02/2014  Physical Exam  Constitutional: She is oriented to person, place, and time. She appears well-developed and well-nourished.  HENT:  Head: Normocephalic and atraumatic.  Cardiovascular: Normal rate and regular rhythm.   No murmur heard. Pulmonary/Chest: Effort normal and breath sounds normal. No respiratory distress.  Abdominal: Soft. There is no tenderness. There is no rebound and no guarding.  Musculoskeletal: She exhibits no edema or tenderness.  Neurological: She is alert  and oriented to person, place, and time.  Skin: Skin is warm and dry.  Psychiatric: She has a normal mood and affect. Her behavior is normal.  Nursing note and vitals reviewed.   ED Course  Procedures   DIAGNOSTIC STUDIES: Oxygen Saturation is 100% on RA, normal by my interpretation.    COORDINATION OF CARE: 10:6 PM Discussed treatment plan which includes pelvic exam and lab work with pt at bedside and pt agreed to plan.  Labs Reviewed  URINALYSIS, ROUTINE W REFLEX MICROSCOPIC (NOT AT [facility]) - Abnormal; Notable for the following:    APPearance CLOUDY (*)    Hgb urine dipstick LARGE (*)    Leukocytes, UA SMALL (*)    All other components within normal limits  URINE MICROSCOPIC-ADD ON - Abnormal; Notable for the following:    Squamous Epithelial / LPF 6-30 (*)    Bacteria, UA MANY (*)    All other components within normal limits  URINE CULTURE  WET PREP, GENITAL  PREGNANCY, URINE  GC/CHLAMYDIA PROBE AMP ([place]) NOT AT [facility]    Imaging Review No results found.  I personally reviewed and evaluated these lab results as a part of my medical decision-making.   MDM   Final diagnoses:  Pelvic pain in female    Patient here for evaluation of pelvic pain and spotting. Urine pregnancy test is negative. Discussed recommendation for pelvic examination to further evaluate for evidence of infection. When returned to the room to perform pelvic exam patient had eloped.  I personally performed the services described in this documentation, which was scribed in my presence. The recorded information has been reviewed and is accurate.    Tilden FossaElizabeth Danil Wedge, MD 02/20/15 0004

## 2015-02-19 NOTE — ED Notes (Signed)
Patient reports that she has not had her mentraul period x 2 months. Reports that she has been checked for STD  - now spotting with brown d/c and having pelvic pain

## 2015-02-22 LAB — URINE CULTURE

## 2021-12-01 ENCOUNTER — Ambulatory Visit (INDEPENDENT_AMBULATORY_CARE_PROVIDER_SITE_OTHER): Payer: Commercial Managed Care - HMO | Admitting: Family Medicine

## 2021-12-01 ENCOUNTER — Encounter: Payer: Self-pay | Admitting: Family Medicine

## 2021-12-01 VITALS — BP 104/60 | HR 80 | Temp 97.9°F | Ht 62.0 in | Wt 285.5 lb

## 2021-12-01 DIAGNOSIS — F411 Generalized anxiety disorder: Secondary | ICD-10-CM

## 2021-12-01 DIAGNOSIS — R7303 Prediabetes: Secondary | ICD-10-CM | POA: Diagnosis not present

## 2021-12-01 DIAGNOSIS — Z1159 Encounter for screening for other viral diseases: Secondary | ICD-10-CM

## 2021-12-01 DIAGNOSIS — J4520 Mild intermittent asthma, uncomplicated: Secondary | ICD-10-CM

## 2021-12-01 DIAGNOSIS — I10 Essential (primary) hypertension: Secondary | ICD-10-CM | POA: Diagnosis not present

## 2021-12-01 DIAGNOSIS — Z114 Encounter for screening for human immunodeficiency virus [HIV]: Secondary | ICD-10-CM

## 2021-12-01 LAB — URINALYSIS, ROUTINE W REFLEX MICROSCOPIC
Bilirubin Urine: NEGATIVE
Ketones, ur: NEGATIVE
Leukocytes,Ua: NEGATIVE
Nitrite: NEGATIVE
Specific Gravity, Urine: 1.015 (ref 1.000–1.030)
Total Protein, Urine: NEGATIVE
Urine Glucose: NEGATIVE
Urobilinogen, UA: 0.2 (ref 0.0–1.0)
WBC, UA: NONE SEEN (ref 0–?)
pH: 6 (ref 5.0–8.0)

## 2021-12-01 LAB — COMPREHENSIVE METABOLIC PANEL
ALT: 24 U/L (ref 0–35)
AST: 18 U/L (ref 0–37)
Albumin: 4.4 g/dL (ref 3.5–5.2)
Alkaline Phosphatase: 49 U/L (ref 39–117)
BUN: 11 mg/dL (ref 6–23)
CO2: 27 mEq/L (ref 19–32)
Calcium: 9.5 mg/dL (ref 8.4–10.5)
Chloride: 101 mEq/L (ref 96–112)
Creatinine, Ser: 1.05 mg/dL (ref 0.40–1.20)
GFR: 67.95 mL/min (ref >60.00)
Glucose, Bld: 85 mg/dL (ref 70–99)
Potassium: 3.6 mEq/L (ref 3.5–5.1)
Sodium: 136 mEq/L (ref 135–145)
Total Bilirubin: 0.5 mg/dL (ref 0.2–1.2)
Total Protein: 8.4 g/dL — ABNORMAL HIGH (ref 6.0–8.3)

## 2021-12-01 LAB — CBC WITH DIFFERENTIAL/PLATELET
Basophils Absolute: 0 10*3/uL (ref 0.0–0.1)
Basophils Relative: 0.8 % (ref 0.0–3.0)
Eosinophils Absolute: 0.1 10*3/uL (ref 0.0–0.7)
Eosinophils Relative: 1.3 % (ref 0.0–5.0)
HCT: 41.3 % (ref 36.0–46.0)
Hemoglobin: 13.7 g/dL (ref 12.0–15.0)
Lymphocytes Relative: 44.2 % (ref 12.0–46.0)
Lymphs Abs: 2.6 10*3/uL (ref 0.7–4.0)
MCHC: 33.2 g/dL (ref 30.0–36.0)
MCV: 82.3 fl (ref 78.0–100.0)
Monocytes Absolute: 0.4 10*3/uL (ref 0.1–1.0)
Monocytes Relative: 6.5 % (ref 3.0–12.0)
Neutro Abs: 2.8 10*3/uL (ref 1.4–7.7)
Neutrophils Relative %: 47.2 % (ref 43.0–77.0)
Platelets: 380 10*3/uL (ref 150.0–400.0)
RBC: 5.01 Mil/uL (ref 3.87–5.11)
RDW: 14.7 % (ref 11.5–15.5)
WBC: 5.9 10*3/uL (ref 4.0–10.5)

## 2021-12-01 LAB — MICROALBUMIN / CREATININE URINE RATIO
Creatinine,U: 102 mg/dL
Microalb Creat Ratio: 0.7 mg/g (ref 0.0–30.0)
Microalb, Ur: <0.7 mg/dL (ref 0.0–1.9)

## 2021-12-01 LAB — HEMOGLOBIN A1C: Hgb A1c MFr Bld: 6.3 % (ref 4.6–6.5)

## 2021-12-01 LAB — LIPID PANEL
Cholesterol: 170 mg/dL (ref 0–200)
HDL: 42.7 mg/dL (ref >39.00)
LDL Cholesterol: 105 mg/dL — ABNORMAL HIGH (ref 0–99)
NonHDL: 127.41
Total CHOL/HDL Ratio: 4
Triglycerides: 111 mg/dL (ref 0.0–149.0)
VLDL: 22.2 mg/dL (ref 0.0–40.0)

## 2021-12-01 LAB — TSH: TSH: 1.14 u[IU]/mL (ref 0.35–5.50)

## 2021-12-01 MED ORDER — ESCITALOPRAM OXALATE 10 MG PO TABS: 10.0000 mg | ORAL_TABLET | Freq: Every day | ORAL | 1 refills | Status: DC

## 2021-12-01 MED ORDER — ALBUTEROL SULFATE HFA 108 (90 BASE) MCG/ACT IN AERS: 2.0000 | INHALATION_SPRAY | Freq: Four times a day (QID) | RESPIRATORY_TRACT | 1 refills | Status: DC | PRN

## 2021-12-01 NOTE — Patient Instructions (Signed)
Welcome to [facility]! It was a pleasure meeting you today.  As discussed, Please schedule a 1 month follow up visit today.  Lexapro 1/2 tab/day for 1 wk then 1 tab/day  PLEASE NOTE:  If you had any LAB tests please let us know if you have not heard back within a few days. You may see your results on MyChart before we have a chance to review them but we will give you a call once they are reviewed by [place]. If we ordered any REFERRALS today, please let us know if you have not heard from their office within the next week.  Let us know through MyChart if you are needing REFILLS, or have your pharmacy send [place] the request. You can also use MyChart to communicate with me or any office staff.  Please try these tips to maintain a healthy lifestyle:  Eat most of your calories during the day when you are active. Eliminate processed foods including packaged sweets (pies, cakes, cookies), reduce intake of potatoes, white bread, white pasta, and white rice. Look for whole grain options, oat flour or almond flour.  Each meal should contain half fruits/vegetables, one quarter protein, and one quarter carbs (no bigger than a computer mouse).  Cut down on sweet beverages. This includes juice, soda, and sweet tea. Also watch fruit intake, though this is a healthier sweet option, it still contains natural sugar! Limit to 3 servings daily.  Drink at least 1 glass of water with each meal and aim for at least 8 glasses per day  Exercise at least 150 minutes every week.

## 2021-12-01 NOTE — Progress Notes (Unsigned)
New Patient Office Visit  Subjective:  Patient ID: Katherine Weeks, female    DOB: 07-Feb-1986  Age: 36 y.o. MRN: [id number]  CC:  Chief Complaint  Patient presents with   Establish Care    Need new pcp Fasting        HPI Katherine Weeks presents for new pt  Anxiety-makes blood pressure increase-went to [facility] and placed on meds. Tolds needs to f/u.  Not taking care of self.  A lot of stress.  Ativan and hydroxyzine in past.   Can have panic.  No SI HTN-taking losartan for 3 days.  Exercising for 3 wks.  Bp's were 140's/101,  172/112.  Pulse 90-100's.  Works 100 hrs/wk.  Was getting a lot of HA. Some edema as well.  Was getting some chest pressure. Pre DM in past-was on metformin in past.  Phtermine intermitt-on this wk.  Asthma-occ sob.  - Past Medical History:  Diagnosis Date   Allergy    Anxiety    Asthma    GERD (gastroesophageal reflux disease)     History reviewed. No pertinent surgical history.  Family History  Problem Relation Age of Onset   Hypertension Mother    Hyperlipidemia Mother    Diabetes Mother    Asthma Mother    Arthritis Mother    Hypertension Father    Hyperlipidemia Father    Drug abuse Father    Depression Father    Diabetes Father    Alcohol abuse Father    Asthma Father    Heart attack Father    Hyperlipidemia Paternal Grandfather    Diabetes Paternal Grandfather    Arthritis Paternal Grandfather    Mental illness Paternal Grandfather     Social History   Socioeconomic History   Marital status: Single    Spouse name: Not on file   Number of children: Not on file   Years of education: Not on file   Highest education level: Not on file  Occupational History   Not on file  Tobacco Use   Smoking status: Every Day    Packs/day: 1.00    Types: Cigarettes   Smokeless tobacco: Not on file  Vaping Use   Vaping Use: Never used  Substance and Sexual Activity   Alcohol use: Yes    Alcohol/week: 2.0 standard drinks of alcohol     Types: 2 Glasses of wine per week   Drug use: No   Sexual activity: Yes    Birth control/protection: Condom  Other Topics Concern   Not on file  Social History Narrative   Not on file   Social Determinants of Health   Financial Resource Strain: Not on file  Food Insecurity: Not on file  Transportation Needs: Not on file  Physical Activity: Not on file  Stress: Not on file  Social Connections: Not on file  Intimate Partner Violence: Not on file    ROS  ROS: Gen: no fever, chills  Skin: no rash, itching ENT: no ear pain, ear drainage, nasal congestion, rhinorrhea, sinus pressure, sore throat Eyes: no blurry vision, double vision Resp: no cough, wheeze,SOB CV: no CP, palpitations, LE edema,  GI: no heartburn, n/v/d/c, abd pain GU: no dysuria, urgency, frequency, hematuria MSK: no joint pain, myalgias, back pain Neuro: no dizziness, headache, weakness, vertigo Psych: no depression, anxiety, insomnia, SI   Objective:   Today's Vitals: BP 104/60   Pulse 80   Temp 97.9 F (36.6 C) (Temporal)   Ht 5\' 2"  (  1.575 m)   Wt 285 lb 8 oz (129.5 kg)   LMP 11/22/2021 (Exact Date)   SpO2 97%   BMI 52.22 kg/m   Physical Exam  Gen: WDWN NAD HEENT: NCAT, conjunctiva not injected, sclera nonicteric TM WNL B, OP moist, no exudates  NECK:  supple, no thyromegaly, no nodes, no carotid bruits CARDIAC: RRR, S1S2+, no murmur. DP 2+B LUNGS: CTAB. No wheezes ABDOMEN:  BS+, soft, NTND, No HSM, no masses EXT:  no edema MSK: no gross abnormalities.  NEURO: A&O x3.  CN II-XII intact.  PSYCH: normal mood. Good eye contact   Assessment & Plan:   Problem List Items Addressed This Visit   None   Outpatient Encounter Medications as of 12/01/2021  Medication Sig   albuterol (VENTOLIN HFA) 108 (90 Base) MCG/ACT inhaler Inhale into the lungs.   hydrOXYzine (VISTARIL) 25 MG capsule Take by mouth.   LORazepam (ATIVAN) 1 MG tablet Take 1 tablet (1 mg total) by mouth 3 (three) times daily as  needed for anxiety.   losartan (COZAAR) 50 MG tablet Take 50 mg by mouth daily.   naproxen (NAPROSYN) 500 MG tablet Take by mouth.   phentermine (ADIPEX-P) 37.5 MG tablet Take 37.5 mg by mouth daily.   [DISCONTINUED] albuterol (PROVENTIL HFA;VENTOLIN HFA) 108 (90 BASE) MCG/ACT inhaler Inhale 1-2 puffs into the lungs every 4 (four) hours as needed for wheezing or shortness of breath (and/or cough).   [DISCONTINUED] azithromycin (ZITHROMAX Z-PAK) 250 MG tablet Take 2 tabs on day 1 and then 1 tab daily on days 2-5 (Patient not taking: Reported on 03/14/2014)   [DISCONTINUED] cetirizine (ZYRTEC) 10 MG tablet Take 10 mg by mouth daily.   [DISCONTINUED] chlorpheniramine-HYDROcodone (TUSSIONEX PENNKINETIC ER) 10-8 MG/5ML LQCR Take 5 mLs by mouth every 12 (twelve) hours as needed. (Patient not taking: Reported on 03/14/2014)   [DISCONTINUED] clindamycin (CLEOCIN) 300 MG capsule Take 1 capsule (300 mg total) by mouth 3 (three) times daily. (Patient not taking: Reported on 03/14/2014)   [DISCONTINUED] ibuprofen (ADVIL,MOTRIN) 200 MG tablet Take 400 mg by mouth every 6 (six) hours as needed for mild pain or moderate pain.   [DISCONTINUED] sodium chloride (OCEAN) 0.65 % SOLN nasal spray Place 1 spray into both nostrils 3 (three) times daily as needed for congestion.   [DISCONTINUED] triamcinolone cream (KENALOG) 0.1 % Apply 1 application topically 2 (two) times daily. (Patient not taking: Reported on 03/14/2014)   [DISCONTINUED] Vitamin D, Ergocalciferol, (DRISDOL) 50000 UNITS CAPS capsule Take 50,000 Units by mouth once a week.   No facility-administered encounter medications on file as of 12/01/2021.    Follow-up: No follow-ups on file.   Angelena Sole, MD

## 2021-12-04 LAB — HEPATITIS C ANTIBODY: Hepatitis C Ab: NONREACTIVE

## 2021-12-04 LAB — HIV ANTIBODY (ROUTINE TESTING W REFLEX): HIV 1&2 Ab, 4th Generation: NONREACTIVE

## 2021-12-04 NOTE — Progress Notes (Signed)
Labs ok except sugars almost diabetes     of course, work on diet/exercise as discussed.  If willing, start metformin 500mg  daily w/supper

## 2021-12-05 ENCOUNTER — Other Ambulatory Visit: Payer: Self-pay | Admitting: *Deleted

## 2021-12-05 MED ORDER — METFORMIN HCL 500 MG PO TABS: 500.0000 mg | ORAL_TABLET | Freq: Every day | ORAL | 3 refills | Status: DC

## 2021-12-29 ENCOUNTER — Ambulatory Visit (INDEPENDENT_AMBULATORY_CARE_PROVIDER_SITE_OTHER): Payer: Commercial Managed Care - HMO | Admitting: Family Medicine

## 2021-12-29 ENCOUNTER — Encounter: Payer: Self-pay | Admitting: Family Medicine

## 2021-12-29 VITALS — BP 124/82 | HR 89 | Temp 97.7°F | Ht 62.0 in | Wt 286.2 lb

## 2021-12-29 DIAGNOSIS — F411 Generalized anxiety disorder: Secondary | ICD-10-CM | POA: Diagnosis not present

## 2021-12-29 DIAGNOSIS — Z6841 Body Mass Index (BMI) 40.0 and over, adult: Secondary | ICD-10-CM

## 2021-12-29 DIAGNOSIS — R7303 Prediabetes: Secondary | ICD-10-CM

## 2021-12-29 DIAGNOSIS — I10 Essential (primary) hypertension: Secondary | ICD-10-CM | POA: Diagnosis not present

## 2021-12-29 LAB — COMPREHENSIVE METABOLIC PANEL
ALT: 19 U/L (ref 0–35)
AST: 15 U/L (ref 0–37)
Albumin: 4.1 g/dL (ref 3.5–5.2)
Alkaline Phosphatase: 46 U/L (ref 39–117)
BUN: 11 mg/dL (ref 6–23)
CO2: 26 mEq/L (ref 19–32)
Calcium: 8.8 mg/dL (ref 8.4–10.5)
Chloride: 105 mEq/L (ref 96–112)
Creatinine, Ser: 1.02 mg/dL (ref 0.40–1.20)
GFR: 70.32 mL/min (ref >60.00)
Glucose, Bld: 101 mg/dL — ABNORMAL HIGH (ref 70–99)
Potassium: 3.8 mEq/L (ref 3.5–5.1)
Sodium: 139 mEq/L (ref 135–145)
Total Bilirubin: 0.3 mg/dL (ref 0.2–1.2)
Total Protein: 7.4 g/dL (ref 6.0–8.3)

## 2021-12-29 MED ORDER — ESCITALOPRAM OXALATE 10 MG PO TABS: 10.0000 mg | ORAL_TABLET | Freq: Every day | ORAL | 1 refills | Status: DC

## 2021-12-29 MED ORDER — WEGOVY 0.5 MG/0.5ML ~~LOC~~ SOAJ: 0.5000 mg | SUBCUTANEOUS | 0 refills | Status: DC

## 2021-12-29 MED ORDER — LOSARTAN POTASSIUM 50 MG PO TABS: 50.0000 mg | ORAL_TABLET | Freq: Every day | ORAL | 1 refills | Status: DC

## 2021-12-29 NOTE — Patient Instructions (Signed)
It was very nice to see you today! So glad doing better.  Continue diet/exercise Wegovy sent to pharmacy   PLEASE NOTE:  If you had any lab tests please let us know if you have not heard back within a few days. You may see your results on MyChart before we have a chance to review them but we will give you a call once they are reviewed by [place]. If we ordered any referrals today, please let us know if you have not heard from their office within the next week.   Please try these tips to maintain a healthy lifestyle:  Eat most of your calories during the day when you are active. Eliminate processed foods including packaged sweets (pies, cakes, cookies), reduce intake of potatoes, white bread, white pasta, and white rice. Look for whole grain options, oat flour or almond flour.  Each meal should contain half fruits/vegetables, one quarter protein, and one quarter carbs (no bigger than a computer mouse).  Cut down on sweet beverages. This includes juice, soda, and sweet tea. Also watch fruit intake, though this is a healthier sweet option, it still contains natural sugar! Limit to 3 servings daily.  Drink at least 1 glass of water with each meal and aim for at least 8 glasses per day  Exercise at least 150 minutes every week.

## 2021-12-29 NOTE — Progress Notes (Signed)
Subjective:     Patient ID: Katherine Weeks, female    DOB: Aug 16, 1985, 36 y.o.   MRN: [id number]  Chief Complaint  Patient presents with   Follow-up    4 week follow-up for moods and HTN Metformin causing loose stools     HPI  HTN-Pt is on losartan 50mg .  Bp's running  120's/70-80.  No ha/dizziness/cp/palp/edema/cough/sob  PreDM-on metformin-diarrhea sev x/day even if eating right.  Walking and working on diet.  Anxiety-on Lexapro 10mg .  No Si.  Doing breathing techniques as well.   Health Maintenance Due  Topic Date Due   PAP SMEAR-Modifier  Never done    Past Medical History:  Diagnosis Date   Allergy    Anxiety    Asthma    GERD (gastroesophageal reflux disease)    Hypertension     History reviewed. No pertinent surgical history.  Outpatient Medications Prior to Visit  Medication Sig Dispense Refill   albuterol (VENTOLIN HFA) 108 (90 Base) MCG/ACT inhaler Inhale 2 puffs into the lungs every 6 (six) hours as needed for wheezing or shortness of breath. 1 each 1   naproxen (NAPROSYN) 500 MG tablet Take by mouth.     phentermine (ADIPEX-P) 37.5 MG tablet Take 37.5 mg by mouth daily.  0   escitalopram (LEXAPRO) 10 MG tablet Take 1 tablet (10 mg total) by mouth daily. 30 tablet 1   losartan (COZAAR) 50 MG tablet Take 50 mg by mouth daily.     metFORMIN (GLUCOPHAGE) 500 MG tablet Take 1 tablet (500 mg total) by mouth daily with supper. 90 tablet 3   No facility-administered medications prior to visit.    Allergies  Allergen Reactions   Penicillins Itching   ROS neg/noncontributory except as noted HPI/below      Objective:     BP 124/82   Pulse 89   Temp 97.7 F (36.5 C) (Temporal)   Ht 5\' 2"  (1.575 m)   Wt 286 lb 4 oz (129.8 kg)   LMP 12/18/2021 (Exact Date)   SpO2 99%   BMI 52.36 kg/m  Wt Readings from Last 3 Encounters:  12/29/21 286 lb 4 oz (129.8 kg)  12/01/21 285 lb 8 oz (129.5 kg)  02/19/15 250 lb (113.4 kg)    Physical Exam   Gen: WDWN  NAD HEENT: NCAT, conjunctiva not injected, sclera nonicteric NECK:  supple, no thyromegaly, no nodes, no carotid bruits CARDIAC: RRR, S1S2+, no murmur. DP 2+B LUNGS: CTAB. No wheezes ABDOMEN:  BS+, soft, NTND, No HSM, no masses EXT:  no edema MSK: no gross abnormalities.  NEURO: A&O x3.  CN II-XII intact.  PSYCH: normal mood. Good eye contact     Assessment & Plan:   Problem List Items Addressed This Visit       Cardiovascular and Mediastinum   Primary hypertension - Primary   Relevant Medications   losartan (COZAAR) 50 MG tablet   Other Relevant Orders   Comprehensive metabolic panel     Other   GAD (generalized anxiety disorder)   Relevant Medications   escitalopram (LEXAPRO) 10 MG tablet   Prediabetes   Other Visit Diagnoses     Morbid obesity with BMI of 50.0-59.9, adult ([place])       Relevant Medications   Semaglutide-Weight Management (WEGOVY) 0.5 MG/0.5ML SOAJ      HTN-chronic.  Controlled on Losartan 50mg -continue.  Check cmp. PreDM-metformin causes diarrhea.  Offered the SL form-declined.  Will trial wegovy 0.5mg  weekly GAD-chonric.  Better on Lexapro 10mg .  Cont.   Meds ordered this encounter  Medications   escitalopram (LEXAPRO) 10 MG tablet    Sig: Take 1 tablet (10 mg total) by mouth daily.    Dispense:  90 tablet    Refill:  1   losartan (COZAAR) 50 MG tablet    Sig: Take 1 tablet (50 mg total) by mouth daily.    Dispense:  90 tablet    Refill:  1   Semaglutide-Weight Management (WEGOVY) 0.5 MG/0.5ML SOAJ    Sig: Inject 0.5 mg into the skin once a week.    Dispense:  2 mL    Refill:  0    Wellington Hampshire, MD

## 2022-01-09 ENCOUNTER — Encounter: Payer: Self-pay | Admitting: Family Medicine

## 2022-01-15 NOTE — Telephone Encounter (Signed)
Patient states she has been unable to locate a Pharmacy that has Semaglutide-Weight Management (WEGOVY) 0.5 MG/0.5ML SOAJ  or 0.25.  Patient states she has called many Pharmacies and Patient has been out of the above medication since late September.  Patient requests to be called at ph# [phone] to be advised as to what to do.

## 2022-02-27 ENCOUNTER — Telehealth: Payer: Self-pay | Admitting: Family Medicine

## 2022-02-27 NOTE — Telephone Encounter (Signed)
Patient states Pharmacy does not have anything lower than 2.4 ML of Wegovy. Patient states she was never able to pick RX for Wegovy 0.5ML due to Pharmacy was out of stock.  Patient requests to be called at ph# [phone] to be advised of what can be done regarding this issue. Patient states maybe there is an alternative medication.  Patient states she is gaining weight even though she is exercising as advised.

## 2022-02-27 NOTE — Telephone Encounter (Signed)
Tried calling patient, message saying call cannot be completed as dialed. Will try again later.

## 2022-02-27 NOTE — Telephone Encounter (Signed)
Please see message below and advise.

## 2022-02-28 ENCOUNTER — Encounter: Payer: Self-pay | Admitting: *Deleted

## 2022-02-28 ENCOUNTER — Encounter: Payer: Self-pay | Admitting: Family Medicine

## 2022-02-28 ENCOUNTER — Other Ambulatory Visit: Payer: Self-pay | Admitting: Family Medicine

## 2022-02-28 MED ORDER — PHENTERMINE HCL 37.5 MG PO TABS: 37.5000 mg | ORAL_TABLET | Freq: Every day | ORAL | 0 refills | Status: DC

## 2022-02-28 NOTE — Telephone Encounter (Signed)
Tried calling patient, message saying call cannot be completed as dialed. Will try again later. Sent a [employer] with advice below.

## 2022-02-28 NOTE — Telephone Encounter (Signed)
Patient sent mychart message concerning medication.

## 2022-02-28 NOTE — Telephone Encounter (Signed)
Pt returned your call. I have updated her number. Please call back.

## 2022-02-28 NOTE — Telephone Encounter (Signed)
Patient called back and stated that she wanted phentermine sent to the pharmacy.

## 2022-03-30 ENCOUNTER — Ambulatory Visit: Payer: Commercial Managed Care - HMO | Admitting: Family Medicine

## 2022-05-28 ENCOUNTER — Other Ambulatory Visit: Payer: Self-pay | Admitting: Family Medicine

## 2022-05-28 MED ORDER — PHENTERMINE HCL 37.5 MG PO TABS: 37.5000 mg | ORAL_TABLET | Freq: Every day | ORAL | 0 refills | Status: DC

## 2022-06-11 ENCOUNTER — Ambulatory Visit: Payer: Commercial Managed Care - HMO | Admitting: Nurse Practitioner

## 2022-06-11 ENCOUNTER — Ambulatory Visit (INDEPENDENT_AMBULATORY_CARE_PROVIDER_SITE_OTHER): Payer: Self-pay | Admitting: Family Medicine

## 2022-06-11 ENCOUNTER — Encounter: Payer: Self-pay | Admitting: Family Medicine

## 2022-06-11 VITALS — BP 122/80 | HR 76 | Temp 98.2°F | Ht 62.0 in | Wt 288.4 lb

## 2022-06-11 DIAGNOSIS — R7303 Prediabetes: Secondary | ICD-10-CM

## 2022-06-11 DIAGNOSIS — Z6841 Body Mass Index (BMI) 40.0 and over, adult: Secondary | ICD-10-CM | POA: Diagnosis not present

## 2022-06-11 DIAGNOSIS — I10 Essential (primary) hypertension: Secondary | ICD-10-CM

## 2022-06-11 MED ORDER — ESCITALOPRAM OXALATE 10 MG PO TABS: 10.0000 mg | ORAL_TABLET | Freq: Every day | ORAL | 1 refills | Status: DC

## 2022-06-11 MED ORDER — ZEPBOUND 2.5 MG/0.5ML ~~LOC~~ SOAJ: 2.5000 mg | SUBCUTANEOUS | 0 refills | Status: DC

## 2022-06-11 MED ORDER — LOSARTAN POTASSIUM 50 MG PO TABS: 50.0000 mg | ORAL_TABLET | Freq: Every day | ORAL | 1 refills | Status: DC

## 2022-06-11 NOTE — Progress Notes (Signed)
Subjective:     Patient ID: Katherine Weeks, female    DOB: 1985-11-21, 37 y.o.   MRN: [id number]  Chief Complaint  Patient presents with   Follow-up    3 month follow-up on HTN, predm, weight Discuss new rx for Wegovy Fasting     HPI  HTN-Pt is on losartan '50mg'$ .  Bp's running  120's/70-80.  No ha/dizziness/cp/palp/edema/cough/sob  PreDM-trying diet/exercise Obesity-on phentermine as shortage on wegovy so can't get.  Getting dry mouth.  Exercising.  Breakfast eggs, avacado toast.  Snack, crackers, nuts, water, lunch salad, [place] sandwich(gab n go).  Dinner-talapia, veges. Chex mix snacks, cashews/fruit.  Crackers.   Does protein drinks at times.   -   Past Medical History:  Diagnosis Date   Allergy    Anxiety    Asthma    GERD (gastroesophageal reflux disease)    Hypertension     History reviewed. No pertinent surgical history.  Outpatient Medications Prior to Visit  Medication Sig Dispense Refill   albuterol (VENTOLIN HFA) 108 (90 Base) MCG/ACT inhaler Inhale 2 puffs into the lungs every 6 (six) hours as needed for wheezing or shortness of breath. 1 each 1   escitalopram (LEXAPRO) 10 MG tablet Take 1 tablet (10 mg total) by mouth daily. 90 tablet 1   losartan (COZAAR) 50 MG tablet Take 1 tablet (50 mg total) by mouth daily. 90 tablet 1   phentermine (ADIPEX-P) 37.5 MG tablet Take 1 tablet (37.5 mg total) by mouth daily. 30 tablet 0   Semaglutide-Weight Management (WEGOVY) 0.5 MG/0.5ML SOAJ Inject 0.5 mg into the skin once a week. (Patient not taking: Reported on 06/11/2022) 2 mL 0   No facility-administered medications prior to visit.    Allergies  Allergen Reactions   Penicillins Itching   ROS neg/noncontributory except as noted HPI/below      Objective:     BP 122/80   Pulse 76   Temp 98.2 F (36.8 C) (Temporal)   Ht '5\' 2"'$  (1.575 m)   Wt 288 lb 6 oz (130.8 kg)   LMP 05/28/2022 (Approximate)   SpO2 97%   BMI 52.74 kg/m  Wt Readings from Last 3  Encounters:  06/11/22 288 lb 6 oz (130.8 kg)  12/29/21 286 lb 4 oz (129.8 kg)  12/01/21 285 lb 8 oz (129.5 kg)    Physical Exam   Gen: WDWN NAD HEENT: NCAT, conjunctiva not injected, sclera nonicteric NECK:  supple, no thyromegaly, no nodes, no carotid bruits CARDIAC: RRR, S1S2+, no murmur. DP 2+B LUNGS: CTAB. No wheezes EXT:  no edema MSK: no gross abnormalities.  NEURO: A&O x3.  CN II-XII intact.  PSYCH: normal mood. Good eye contact     Assessment & Plan:   Problem List Items Addressed This Visit       Cardiovascular and Mediastinum   Primary hypertension - Primary   Relevant Medications   losartan (COZAAR) 50 MG tablet     Other   Prediabetes   Other Visit Diagnoses     Morbid obesity with BMI of 50.0-59.9, adult ([place])       Relevant Medications   tirzepatide (ZEPBOUND) 2.5 MG/0.5ML Pen      HTN-chronic.  Controlled.  Cont cozaar '50mg'$  daily.  F/u 3 mo preDM-working on diet/exercise-but a lot of starches.  Decrease Morbid obesity-phentermine not working.  Working on diet/exercise, but a lot of starches and unhealthy choices-advised to decrease starches, measure portion sizes, etc.  Will try zepbound if insurance covers.  Shortage still  of wegovy F/u 3 mo  Meds ordered this encounter  Medications   tirzepatide (ZEPBOUND) 2.5 MG/0.5ML Pen    Sig: Inject 2.5 mg into the skin once a week.    Dispense:  2 mL    Refill:  0   escitalopram (LEXAPRO) 10 MG tablet    Sig: Take 1 tablet (10 mg total) by mouth daily.    Dispense:  90 tablet    Refill:  1   losartan (COZAAR) 50 MG tablet    Sig: Take 1 tablet (50 mg total) by mouth daily.    Dispense:  90 tablet    Refill:  1    Wellington Hampshire, MD

## 2022-06-11 NOTE — Patient Instructions (Signed)
It was very nice to see you today!  Sending zepbound-call monthly for dosing   PLEASE NOTE:  If you had any lab tests please let us know if you have not heard back within a few days. You may see your results on MyChart before we have a chance to review them but we will give you a call once they are reviewed by [place]. If we ordered any referrals today, please let us know if you have not heard from their office within the next week.   Please try these tips to maintain a healthy lifestyle:  Eat most of your calories during the day when you are active. Eliminate processed foods including packaged sweets (pies, cakes, cookies), reduce intake of potatoes, white bread, white pasta, and white rice. Look for whole grain options, oat flour or almond flour.  Each meal should contain half fruits/vegetables, one quarter protein, and one quarter carbs (no bigger than a computer mouse).  Cut down on sweet beverages. This includes juice, soda, and sweet tea. Also watch fruit intake, though this is a healthier sweet option, it still contains natural sugar! Limit to 3 servings daily.  Drink at least 1 glass of water with each meal and aim for at least 8 glasses per day  Exercise at least 150 minutes every week.

## 2022-07-31 ENCOUNTER — Other Ambulatory Visit: Payer: Self-pay | Admitting: Family Medicine

## 2022-07-31 MED ORDER — ZEPBOUND 2.5 MG/0.5ML ~~LOC~~ SOAJ: 2.5000 mg | SUBCUTANEOUS | 0 refills | Status: DC

## 2022-08-01 ENCOUNTER — Other Ambulatory Visit: Payer: Self-pay | Admitting: Family Medicine

## 2022-08-01 ENCOUNTER — Encounter: Payer: Self-pay | Admitting: Family Medicine

## 2022-08-01 MED ORDER — CONTRAVE 8-90 MG PO TB12: ORAL_TABLET | ORAL | 1 refills | Status: DC

## 2022-08-01 NOTE — Telephone Encounter (Signed)
Pt states pharmacy informed her that zepbound is not covered by new insurance. States they sent an alternative recommendation, Contrave, to PCP for approval or denial.   Pt would like to know if alternative medication, contrave, could be prescribed without another visit.

## 2022-08-02 ENCOUNTER — Telehealth: Payer: Self-pay | Admitting: *Deleted

## 2022-08-02 NOTE — Telephone Encounter (Signed)
Pt states she wanted to make sure her message was received. States pharmacy keeps contacting her.

## 2022-08-02 NOTE — Telephone Encounter (Signed)
Naltrexone-buPROPion HCl ER (CONTRAVE) 8-90 MG TB12, Please complete PA. Thanks

## 2022-08-07 ENCOUNTER — Telehealth: Payer: Self-pay | Admitting: *Deleted

## 2022-08-07 NOTE — Telephone Encounter (Signed)
Left message to return call concerning PA. Per Bonita Quin, front office: Erie Noe with [employer] is on the phone asking to speak with you re: MRN: [id number] Guice (needs BMI for PA).

## 2022-08-08 NOTE — Telephone Encounter (Signed)
Form received from [employer], faxed over last office note.

## 2022-08-10 ENCOUNTER — Telehealth: Payer: Self-pay

## 2022-08-10 ENCOUNTER — Other Ambulatory Visit (HOSPITAL_COMMUNITY): Payer: Self-pay

## 2022-08-10 NOTE — Telephone Encounter (Signed)
Pt called for clarification on status of appeal. Pt states insurance informed her that both the PA and appeal were denied. Pt states she is confused due to me informing her that we are waiting for the determination of the appeal as described below. Please advise on status of appeal.

## 2022-08-10 NOTE — Telephone Encounter (Signed)
Noted  

## 2022-08-10 NOTE — Telephone Encounter (Signed)
Placed a call to [phone] to check the status of the appeal for Contrave.   Per the representative, determination should be done by 09/06/2022.   Appeal # [id number]

## 2022-08-13 ENCOUNTER — Other Ambulatory Visit (HOSPITAL_COMMUNITY): Payer: Self-pay

## 2022-08-13 NOTE — Telephone Encounter (Signed)
Left message to return call to office.

## 2022-08-13 NOTE — Telephone Encounter (Signed)
Mychart message sent informing patient of message below

## 2022-08-13 NOTE — Telephone Encounter (Signed)
Patient Advocate Encounter  Prior Authorization for Katherine Weeks has been approved with [employer].    Call reference # [id number] Effective dates: 08/10/22 through 11/10/22  Placed a call to [facility] to notify of the approval. Spoke with the pharmacist and he stated the copay would be $250.22 for 30 days.

## 2022-08-28 ENCOUNTER — Other Ambulatory Visit: Payer: Self-pay | Admitting: Family Medicine

## 2022-08-28 MED ORDER — PHENTERMINE HCL 37.5 MG PO TABS: ORAL_TABLET | ORAL | 2 refills | Status: AC

## 2022-09-28 ENCOUNTER — Ambulatory Visit: Payer: Medicaid Other | Admitting: Family Medicine

## 2022-10-12 ENCOUNTER — Ambulatory Visit: Payer: 59 | Admitting: Family Medicine

## 2022-10-12 ENCOUNTER — Encounter: Payer: Self-pay | Admitting: Family Medicine

## 2022-10-12 VITALS — BP 125/75 | HR 95 | Temp 98.4°F | Resp 18 | Ht 62.0 in | Wt 294.1 lb

## 2022-10-12 DIAGNOSIS — Z23 Encounter for immunization: Secondary | ICD-10-CM | POA: Diagnosis not present

## 2022-10-12 DIAGNOSIS — R1011 Right upper quadrant pain: Secondary | ICD-10-CM

## 2022-10-12 DIAGNOSIS — K219 Gastro-esophageal reflux disease without esophagitis: Secondary | ICD-10-CM | POA: Diagnosis not present

## 2022-10-12 DIAGNOSIS — R7303 Prediabetes: Secondary | ICD-10-CM | POA: Diagnosis not present

## 2022-10-12 DIAGNOSIS — I10 Essential (primary) hypertension: Secondary | ICD-10-CM

## 2022-10-12 DIAGNOSIS — F411 Generalized anxiety disorder: Secondary | ICD-10-CM

## 2022-10-12 LAB — CBC WITH DIFFERENTIAL/PLATELET
Absolute Monocytes: 883 cells/uL (ref 200–950)
Basophils Absolute: 39 cells/uL (ref 0–200)
Basophils Relative: 0.4 %
Eosinophils Absolute: 116 cells/uL (ref 15–500)
Eosinophils Relative: 1.2 %
Hemoglobin: 12.3 g/dL (ref 11.7–15.5)
Lymphs Abs: 4404 cells/uL — ABNORMAL HIGH (ref 850–3900)
MCH: 25.8 pg — ABNORMAL LOW (ref 27.0–33.0)
MCV: 80.5 fL (ref 80.0–100.0)
Monocytes Relative: 9.1 %
Neutro Abs: 4258 cells/uL (ref 1500–7800)
Platelets: 384 10*3/uL (ref 140–400)
Total Lymphocyte: 45.4 %

## 2022-10-12 MED ORDER — ESCITALOPRAM OXALATE 10 MG PO TABS
10.0000 mg | ORAL_TABLET | Freq: Every day | ORAL | 1 refills | Status: AC
Start: 2022-10-12 — End: ?

## 2022-10-12 MED ORDER — OMEPRAZOLE 40 MG PO CPDR
40.0000 mg | DELAYED_RELEASE_CAPSULE | Freq: Every day | ORAL | 3 refills | Status: DC
Start: 2022-10-12 — End: 2023-03-22

## 2022-10-12 MED ORDER — BUSPIRONE HCL 5 MG PO TABS
5.0000 mg | ORAL_TABLET | Freq: Two times a day (BID) | ORAL | 1 refills | Status: DC
Start: 2022-10-12 — End: 2022-11-05

## 2022-10-12 MED ORDER — ALBUTEROL SULFATE HFA 108 (90 BASE) MCG/ACT IN AERS: 2.0000 | INHALATION_SPRAY | Freq: Four times a day (QID) | RESPIRATORY_TRACT | 1 refills | Status: AC | PRN

## 2022-10-12 MED ORDER — LOSARTAN POTASSIUM 50 MG PO TABS
50.0000 mg | ORAL_TABLET | Freq: Every day | ORAL | 1 refills | Status: AC
Start: 2022-10-12 — End: ?

## 2022-10-12 NOTE — Patient Instructions (Signed)
It was very nice to see you today!  [facility]- [phone] other studies    PLEASE NOTE:  If you had any lab tests please let us know if you have not heard back within a few days. You may see your results on MyChart before we have a chance to review them but we will give you a call once they are reviewed by us. If we ordered any referrals today, please let us know if you have not heard from their office within the next week.   Please try these tips to maintain a healthy lifestyle:  Eat most of your calories during the day when you are active. Eliminate processed foods including packaged sweets (pies, cakes, cookies), reduce intake of potatoes, white bread, white pasta, and white rice. Look for whole grain options, oat flour or almond flour.  Each meal should contain half fruits/vegetables, one quarter protein, and one quarter carbs (no bigger than a computer mouse).  Cut down on sweet beverages. This includes juice, soda, and sweet tea. Also watch fruit intake, though this is a healthier sweet option, it still contains natural sugar! Limit to 3 servings daily.  Drink at least 1 glass of water with each meal and aim for at least 8 glasses per day  Exercise at least 150 minutes every week.   

## 2022-10-12 NOTE — Progress Notes (Signed)
Subjective:     Patient ID: Katherine Weeks, female    DOB: May 13, 1985, 37 y.o.   MRN: [id number]  Chief Complaint  Patient presents with   Medication Follow-up    Had to stop Contrave due to side effects, still having GI issues Concerned with not losing weight     HPI Anxiety - She reports constant anxiety/panic attacks about every other day. During these episodes she endorses palpitations and diaphoresis. States she has daily life stressors (work) that may contribute to this. Stopped Contrave due to bad side effects (n/c/fatigue). Is taking Lexapro 10 mg.   Abd pain/Acid Reflux - She complains of right upper abdominal pain and acid reflux, tends to occur after eating. Occasional constipation. States she tends to have bowel movements soon after eating. Has limited her eating/appetite due to symptoms. She states she at times she does not want to eat to avoid pain. using TUMS and pepto bismol. She is receptive to getting an abd ultrasound.  Weight loss - Insurance was not [employer], but will cover the compound. Is interested in her options in getting the compound. She states she has been exercising, but still has gained weight. Has not gone to any weight loss clinics or a nutritionist. Taking phentermine, not completely compliant due to it not helping much.   Blood pressure - She has been monitoring her blood pressures at home. She reports readings from 95-100/65-71. Taking losartan 50 mg.  Shortness of breath - she reports mild shortness of breath with exertion. 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   Health Maintenance Due  Topic Date Due   PAP SMEAR-Modifier  Never done    Past Medical History:  Diagnosis Date   Allergy    Anxiety    Asthma    GERD (gastroesophageal reflux disease)    Hypertension     History reviewed. No pertinent surgical history.   Current Outpatient Medications:    busPIRone (BUSPAR) 5 MG tablet, Take 1 tablet (5 mg total)  by mouth 2 (two) times daily., Disp: 60 tablet, Rfl: 1   omeprazole (PRILOSEC) 40 MG capsule, Take 1 capsule (40 mg total) by mouth daily., Disp: 30 capsule, Rfl: 3   phentermine (ADIPEX-P) 37.5 MG tablet, Take half tablet daily  PO prior to breakfast.After a week, may increase to full tablet in needed, Disp: 30 tablet, Rfl: 2   albuterol (VENTOLIN HFA) 108 (90 Base) MCG/ACT inhaler, Inhale 2 puffs into the lungs every 6 (six) hours as needed for wheezing or shortness of breath., Disp: 1 each, Rfl: 1   escitalopram (LEXAPRO) 10 MG tablet, Take 1 tablet (10 mg total) by mouth daily., Disp: 90 tablet, Rfl: 1   losartan (COZAAR) 50 MG tablet, Take 1 tablet (50 mg total) by mouth daily., Disp: 90 tablet, Rfl: 1  Allergies  Allergen Reactions   Penicillins Itching   ROS neg/noncontributory except as noted HPI/below      Objective:     BP 125/75   Pulse 95   Temp 98.4 F (36.9 C) (Temporal)   Resp 18   Ht 5\' 2"  (1.575 m)   Wt 294 lb 2 oz (133.4 kg)   SpO2 97%   BMI 53.80 kg/m  Wt Readings from Last 3 Encounters:  10/12/22 294 lb 2 oz (133.4 kg)  06/11/22 288 lb 6 oz (130.8 kg)  12/29/21 286 lb 4 oz (129.8 kg)    Physical Exam   Gen: WDWN NAD. +diaphoresis HEENT: NCAT,  conjunctiva not injected, sclera nonicteric NECK:  supple, no thyromegaly, no nodes, no carotid bruits CARDIAC: RRR, S1S2+, no murmur. DP 2+B LUNGS: CTAB. No wheezes ABDOMEN:  BS+, soft, NTND, No HSM, no masses EXT:  no edema MSK: no gross abnormalities.  NEURO: A&O x3.  CN II-XII intact.  PSYCH: normal mood. Good eye contact     Assessment & Plan:  Primary hypertension Assessment & Plan: Chronic.  Controlled.  Continue losartan 50mg  daily  Orders: -     Losartan Potassium; Take 1 tablet (50 mg total) by mouth daily.  Dispense: 90 tablet; Refill: 1 -     Comprehensive metabolic panel  Prediabetes Assessment & Plan: Chronic.  Controlled.  Pt working on diet/exercise.  Declines meds.  Considering  compounded GLP-1 but concerned  Orders: -     Hemoglobin A1c  GAD (generalized anxiety disorder) Assessment & Plan: Chronic.  Not ideal  not wanting to increase Lexapro-continue 10mg  daily.  Add Buspar 5mg  BID and titrate to max 37m bid  Orders: -     Escitalopram Oxalate; Take 1 tablet (10 mg total) by mouth daily.  Dispense: 90 tablet; Refill: 1 -     busPIRone HCl; Take 1 tablet (5 mg total) by mouth 2 (two) times daily.  Dispense: 60 tablet; Refill: 1  Right upper quadrant abdominal pain -     US ABDOMEN LIMITED RUQ (LIVER/GB); Future -     CBC with Differential/Platelet  Gastroesophageal reflux disease without esophagitis -     Omeprazole; Take 1 capsule (40 mg total) by mouth daily.  Dispense: 30 capsule; Refill: 3 -     CBC with Differential/Platelet  Need for tetanus booster -     Tdap vaccine greater than or equal to 7yo IM  Other orders -     Albuterol Sulfate HFA; Inhale 2 puffs into the lungs every 6 (six) hours as needed for wheezing or shortness of breath.  Dispense: 1 each; Refill: 1  4.  RUQ pain-?GERD, gallbladder, other.  Chec u/s 5.  GERD-uncontrolled w/OTC tums/pepto.  Start omeprazole 40mg  daily  Return in about 3 months (around 01/12/2023) for HTN, mood.   I,Rachel Rivera,acting as a scribe for Angelena Sole, MD.,have documented all relevant documentation on the behalf of Angelena Sole, MD,as directed by  Angelena Sole, MD while in the presence of Angelena Sole, MD.  I, Angelena Sole, MD, have reviewed all documentation for this visit. The documentation on 10/13/22 for the exam, diagnosis, procedures, and orders are all accurate and complete.   Angelena Sole, MD

## 2022-10-13 LAB — COMPREHENSIVE METABOLIC PANEL
AG Ratio: 1.4 (calc) (ref 1.0–2.5)
ALT: 22 U/L (ref 6–29)
AST: 15 U/L (ref 10–30)
Albumin: 4.2 g/dL (ref 3.6–5.1)
Alkaline phosphatase (APISO): 48 U/L (ref 31–125)
BUN: 13 mg/dL (ref 7–25)
CO2: 22 mmol/L (ref 20–32)
Calcium: 9.1 mg/dL (ref 8.6–10.2)
Chloride: 103 mmol/L (ref 98–110)
Creat: 0.93 mg/dL (ref 0.50–0.97)
Globulin: 3.1 g/dL (calc) (ref 1.9–3.7)
Glucose, Bld: 80 mg/dL (ref 65–99)
Potassium: 3.8 mmol/L (ref 3.5–5.3)
Sodium: 136 mmol/L (ref 135–146)
Total Bilirubin: 0.3 mg/dL (ref 0.2–1.2)
Total Protein: 7.3 g/dL (ref 6.1–8.1)

## 2022-10-13 LAB — CBC WITH DIFFERENTIAL/PLATELET
HCT: 38.3 % (ref 35.0–45.0)
MCHC: 32.1 g/dL (ref 32.0–36.0)
MPV: 9.5 fL (ref 7.5–12.5)
Neutrophils Relative %: 43.9 %
RBC: 4.76 10*6/uL (ref 3.80–5.10)
RDW: 14.8 % (ref 11.0–15.0)
WBC: 9.7 10*3/uL (ref 3.8–10.8)

## 2022-10-13 LAB — HEMOGLOBIN A1C
Hgb A1c MFr Bld: 6.2 % of total Hgb — ABNORMAL HIGH (ref <5.7)
Mean Plasma Glucose: 131 mg/dL
eAG (mmol/L): 7.3 mmol/L

## 2022-10-13 NOTE — Assessment & Plan Note (Signed)
Chronic.  Not ideal  not wanting to increase Lexapro-continue 10mg  daily.  Add Buspar 5mg  BID and titrate to max 57m bid

## 2022-10-13 NOTE — Assessment & Plan Note (Signed)
Chronic.  Controlled.  Continue losartan 50 mg daily 

## 2022-10-13 NOTE — Assessment & Plan Note (Signed)
Chronic.  Controlled.  Pt working on diet/exercise.  Declines meds.  Considering compounded GLP-1 but concerned

## 2022-10-14 NOTE — Progress Notes (Signed)
Labs stable.  Continue working on diet/exercise as prediabetic.

## 2022-10-26 ENCOUNTER — Other Ambulatory Visit: Payer: 59

## 2022-11-04 ENCOUNTER — Other Ambulatory Visit: Payer: Self-pay | Admitting: Family Medicine

## 2022-11-04 DIAGNOSIS — F411 Generalized anxiety disorder: Secondary | ICD-10-CM

## 2022-11-05 ENCOUNTER — Encounter: Payer: Self-pay | Admitting: Family Medicine

## 2022-11-05 ENCOUNTER — Telehealth: Payer: 59 | Admitting: Physician Assistant

## 2022-11-05 DIAGNOSIS — U071 COVID-19: Secondary | ICD-10-CM

## 2022-11-05 MED ORDER — NIRMATRELVIR/RITONAVIR (PAXLOVID)TABLET
3.0000 | ORAL_TABLET | Freq: Two times a day (BID) | ORAL | 0 refills | Status: AC
Start: 2022-11-05 — End: 2022-11-10

## 2022-11-05 NOTE — Progress Notes (Signed)
Virtual Visit Consent   Katherine Weeks, you are scheduled for a virtual visit with a [facility] provider today. Just as with appointments in the office, your consent must be obtained to participate. Your consent will be active for this visit and any virtual visit you may have with one of our providers in the next 365 days. If you have a MyChart account, a copy of this consent can be sent to you electronically.  As this is a virtual visit, video technology does not allow for your provider to perform a traditional examination. This may limit your provider's ability to fully assess your condition. If your provider identifies any concerns that need to be evaluated in person or the need to arrange testing (such as labs, EKG, etc.), we will make arrangements to do so. Although advances in technology are sophisticated, we cannot ensure that it will always work on either your end or our end. If the connection with a video visit is poor, the visit may have to be switched to a telephone visit. With either a video or telephone visit, we are not always able to ensure that we have a secure connection.  By engaging in this virtual visit, you consent to the provision of healthcare and authorize for your insurance to be billed (if applicable) for the services provided during this visit. Depending on your insurance coverage, you may receive a charge related to this service.  I need to obtain your verbal consent now. Are you willing to proceed with your visit today? Katherine Weeks has provided verbal consent on 11/05/2022 for a virtual visit (video or telephone). [name], PA-C  Date: 11/05/2022 11:19 AM  Virtual Visit via Video Note   I, [name], connected with  Katherine Weeks  ([id number], 26-Feb-1986) on 11/05/22 at 11:15 AM EDT by a video-enabled telemedicine application and verified that I am speaking with the correct person using two identifiers.  Location: Patient: Virtual Visit Location  Patient: Home Provider: Virtual Visit Location Provider: Home Office   I discussed the limitations of evaluation and management by telemedicine and the availability of in person appointments. The patient expressed understanding and agreed to proceed.    History of Present Illness: Katherine Weeks is a 37 y.o. who identifies as a female who was assigned female at birth, and is being seen today for Covid 67.  HPI: URI  This is a new problem. The current episode started yesterday (Tested positive for Covid 19 yesterday; Symptoms started yesterday morning). The problem has been gradually worsening. There has been no fever. Associated symptoms include congestion, coughing, headaches, a plugged ear sensation, rhinorrhea, sinus pain and a sore throat. Pertinent negatives include no diarrhea, ear pain, nausea, vomiting or wheezing. Associated symptoms comments: Myalgias, sweating. Treatments tried: mucinex. The treatment provided no relief.    Has had Covid 19 5 times. Did use Paxlovid the last time she had it and tolerated well.   Problems:  Patient Active Problem List   Diagnosis Date Noted   Primary hypertension 12/01/2021   GAD (generalized anxiety disorder) 12/01/2021   Prediabetes 12/01/2021    Allergies:  Allergies  Allergen Reactions   Penicillins Itching   Medications:  Current Outpatient Medications:    nirmatrelvir/ritonavir (PAXLOVID) 20 x 150 MG & 10 x 100MG  TABS, Take 3 tablets by mouth 2 (two) times daily for 5 days. (Take nirmatrelvir 150 mg two tablets twice daily for 5 days and ritonavir 100 mg one tablet twice daily  for 5 days) Patient GFR is 81, Disp: 30 tablet, Rfl: 0   albuterol (VENTOLIN HFA) 108 (90 Base) MCG/ACT inhaler, Inhale 2 puffs into the lungs every 6 (six) hours as needed for wheezing or shortness of breath., Disp: 1 each, Rfl: 1   busPIRone (BUSPAR) 5 MG tablet, TAKE 1 TABLET BY MOUTH TWICE A DAY, Disp: 180 tablet, Rfl: 0   escitalopram (LEXAPRO) 10 MG tablet,  Take 1 tablet (10 mg total) by mouth daily., Disp: 90 tablet, Rfl: 1   losartan (COZAAR) 50 MG tablet, Take 1 tablet (50 mg total) by mouth daily., Disp: 90 tablet, Rfl: 1   omeprazole (PRILOSEC) 40 MG capsule, Take 1 capsule (40 mg total) by mouth daily., Disp: 30 capsule, Rfl: 3   phentermine (ADIPEX-P) 37.5 MG tablet, Take half tablet daily  PO prior to breakfast.After a week, may increase to full tablet in needed, Disp: 30 tablet, Rfl: 2  Observations/Objective: Patient is well-developed, well-nourished in no acute distress.  Resting comfortably at home.  Head is normocephalic, atraumatic.  No labored breathing.  Speech is clear and coherent with logical content.  Patient is alert and oriented at baseline.    Assessment and Plan: 1. COVID-19 - nirmatrelvir/ritonavir (PAXLOVID) 20 x 150 MG & 10 x 100MG  TABS; Take 3 tablets by mouth 2 (two) times daily for 5 days. (Take nirmatrelvir 150 mg two tablets twice daily for 5 days and ritonavir 100 mg one tablet twice daily for 5 days) Patient GFR is 81  Dispense: 30 tablet; Refill: 0 - MyChart COVID-19 home monitoring program; Future  - Continue OTC symptomatic management of choice - Will send OTC vitamins and supplement information through AVS - Paxlovid prescribed - Patient enrolled in MyChart symptom monitoring - Push fluids - Rest as needed - Discussed return precautions and when to seek in-person evaluation, sent via [facility] as well   Follow Up Instructions: I discussed the assessment and treatment plan with the patient. The patient was provided an opportunity to ask questions and all were answered. The patient agreed with the plan and demonstrated an understanding of the instructions.  A copy of instructions were sent to the patient via MyChart unless otherwise noted below.    The patient was advised to call back or seek an in-person evaluation if the symptoms worsen or if the condition fails to improve as anticipated.  Time:  I  spent 10 minutes with the patient via telehealth technology discussing the above problems/concerns.    [name], PA-C

## 2022-11-05 NOTE — Patient Instructions (Signed)
[name], thank you for joining [name], PA-C for today's virtual visit.  While this provider is not your primary care provider (PCP), if your PCP is located in our provider database this encounter information will be shared with them immediately following your visit.   [facility] MyChart account gives you access to today's visit and all your visits, tests, and labs performed at [facility] " click here if you don't have a  MyChart account or go to [email or web address]  Consent: (Patient) Katherine Weeks provided verbal consent for this virtual visit at the beginning of the encounter.  Current Medications:  Current Outpatient Medications:    nirmatrelvir/ritonavir (PAXLOVID) 20 x 150 MG & 10 x 100MG  TABS, Take 3 tablets by mouth 2 (two) times daily for 5 days. (Take nirmatrelvir 150 mg two tablets twice daily for 5 days and ritonavir 100 mg one tablet twice daily for 5 days) Patient GFR is 81, Disp: 30 tablet, Rfl: 0   albuterol (VENTOLIN HFA) 108 (90 Base) MCG/ACT inhaler, Inhale 2 puffs into the lungs every 6 (six) hours as needed for wheezing or shortness of breath., Disp: 1 each, Rfl: 1   busPIRone (BUSPAR) 5 MG tablet, TAKE 1 TABLET BY MOUTH TWICE A DAY, Disp: 180 tablet, Rfl: 0   escitalopram (LEXAPRO) 10 MG tablet, Take 1 tablet (10 mg total) by mouth daily., Disp: 90 tablet, Rfl: 1   losartan (COZAAR) 50 MG tablet, Take 1 tablet (50 mg total) by mouth daily., Disp: 90 tablet, Rfl: 1   omeprazole (PRILOSEC) 40 MG capsule, Take 1 capsule (40 mg total) by mouth daily., Disp: 30 capsule, Rfl: 3   phentermine (ADIPEX-P) 37.5 MG tablet, Take half tablet daily  PO prior to breakfast.After a week, may increase to full tablet in needed, Disp: 30 tablet, Rfl: 2   Medications ordered in this encounter:  Meds ordered this encounter  Medications   nirmatrelvir/ritonavir (PAXLOVID) 20 x 150 MG & 10 x 100MG  TABS    Sig: Take 3 tablets by mouth 2 (two)  times daily for 5 days. (Take nirmatrelvir 150 mg two tablets twice daily for 5 days and ritonavir 100 mg one tablet twice daily for 5 days) Patient GFR is 81    Dispense:  30 tablet    Refill:  0    Order Specific Question:   Supervising Provider    Answer:   Merrilee Jansky [id number]     *If you need refills on other medications prior to your next appointment, please contact your pharmacy*  Follow-Up: Call back or seek an in-person evaluation if the symptoms worsen or if the condition fails to improve as anticipated.  [facility] [phone]  Care Instructions: Paxlovid (Nirmatrelvir; Ritonavir) Tablets What is this medication? NIRMATRELVIR; RITONAVIR (NIR ma TREL vir; ri TOE na veer) treats mild to moderate COVID-19. It may help people who are at high risk of developing severe illness. It works by limiting the spread of the virus in your body. This medicine may be used for other purposes; ask your health care provider or pharmacist if you have questions. COMMON BRAND NAME(S): PAXLOVID What should I tell my care team before I take this medication? They need to know if you have any of these conditions: Any allergies Any serious illness Kidney disease Liver disease An unusual or allergic reaction to nirmatrelvir, ritonavir, other medications, foods, dyes, or preservatives Pregnant or trying to get pregnant Breast-feeding How should I use this  medication? This product contains 2 different medications that are packaged together. For the standard dose, take 2 pink tablets of nirmatrelvir with 1 white tablet of ritonavir (3 tablets total) by mouth with water twice daily. Talk to your care team if you have kidney disease. You may need a different dose. Swallow the tablets whole. You can take it with or without food. If it upsets your stomach, take it with food. Take all of this medication unless your care team tells you to stop it early. Keep taking it even if you think you  are better. Talk to your care team about the use of this medication in children. While it may be prescribed for children as young as 12 years for selected conditions, precautions do apply. Overdosage: If you think you have taken too much of this medicine contact a poison control center or emergency room at once. NOTE: This medicine is only for you. Do not share this medicine with others. What if I miss a dose? If you miss a dose, take it as soon as you can unless it is more than 8 hours late. If it is more than 8 hours late, skip the missed dose. Take the next dose at the normal time. Do not take extra or 2 doses at the same time to make up for the missed dose. What may interact with this medication? Do not take this medication with any of the following: Alfuzosin Certain medications for anxiety or sleep, such as midazolam or triazolam Certain medications for cancer, such as apalutamide Certain medications for cholesterol, such as lovastatin or simvastatin Certain medications for irregular heartbeat, such as amiodarone, dronedarone, flecainide, propafenone, quinidine Certain medications for mental health conditions, such as lurasidone or pimozide Certain medications for seizures, such as carbamazepine, phenobarbital, phenytoin, primidone Colchicine Eletriptan Eplerenone Ergot alkaloids, such as dihydroergotamine, ergotamine, methylergonovine Finerenone Flibanserin Ivabradine Lomitapide Lumacaftor; ivacaftor Naloxegol Ranolazine Red Yeast Rice Rifampin Rifapentine Sildenafil Silodosin [facility]. John's wort Tolvaptan Ubrogepant Voclosporin This medication may affect how other medications work, and other medications may affect the way this medication works. Talk with your care team about all of the medications you take. They may suggest changes to your treatment plan to lower the risk of side effects and to make sure your medications work as intended. This list may not describe all  possible interactions. Give your health care provider a list of all the medicines, herbs, non-prescription drugs, or dietary supplements you use. Also tell them if you smoke, drink alcohol, or use illegal drugs. Some items may interact with your medicine. What should I watch for while using this medication? Your condition will be monitored carefully while you are receiving this medication. Visit your care team for regular checkups. Tell your care team if your symptoms do not start to get better or if they get worse. If you have untreated HIV infection, this medication may lead to some HIV medications not working as well in the future. Estrogen and progestin hormones may not work as well while you are taking this medication. Your care team can help you find the contraceptive option that works for you. What side effects may I notice from receiving this medication? Side effects that you should report to your care team as soon as possible: Allergic reactions--skin rash, itching, hives, swelling of the face, lips, tongue, or throat Liver injury--right upper belly pain, loss of appetite, nausea, light-colored stool, dark yellow or brown urine, yellowing skin or eyes, unusual weakness or fatigue Redness, blistering, peeling, or  loosening of the skin, including inside the mouth Side effects that usually do not require medical attention (report these to your care team if they continue or are bothersome): Change in taste Diarrhea General discomfort and fatigue Increase in blood pressure Muscle pain Nausea Stomach pain This list may not describe all possible side effects. Call your doctor for medical advice about side effects. You may report side effects to FDA at 1-800-FDA-1088. Where should I keep my medication? Keep out of the reach of children and pets. Store at room temperature between 20 and 25 degrees C (68 and 77 degrees F). Get rid of any unused medication after the expiration date. To get rid of  medications that are no longer needed or have expired: Take the medication to a medication take-back program. Check with your pharmacy or law enforcement to find a location. If you cannot return the medication, check the label or package insert to see if the medication should be thrown out in the garbage or flushed down the toilet. If you are not sure, ask your care team. If it is safe to put it in the trash, take the medication out of the container. Mix the medication with cat litter, dirt, coffee grounds, or other unwanted substance. Seal the mixture in a bag or container. Put it in the trash. NOTE: This sheet is a summary. It may not cover all possible information. If you have questions about this medicine, talk to your doctor, pharmacist, or health care provider.  2024 Elsevier/Gold Standard (2022-05-07 00:00:00)    Isolation Instructions: You are to isolate at home until you have been fever free for at least 24 hours without a fever-reducing medication, and symptoms have been steadily improving for 24 hours. At that time,  you can end isolation but need to mask for an additional 5 days.   If you must be around other household members who do not have symptoms, you need to make sure that both you and the family members are masking consistently with a high-quality mask.  If you note any worsening of symptoms despite treatment, please seek an in-person evaluation ASAP. If you note any significant shortness of breath or any chest pain, please seek ER evaluation. Please do not delay care!   COVID-19: What to Do if You Are Sick If you test positive and are an older adult or someone who is at high risk of getting very sick from COVID-19, treatment may be available. Contact a healthcare provider right away after a positive test to determine if you are eligible, even if your symptoms are mild right now. You can also visit a Test to Treat location and, if eligible, receive a prescription from a provider.  Don't delay: Treatment must be started within the first few days to be effective. If you have a fever, cough, or other symptoms, you might have COVID-19. Most people have mild illness and are able to recover at home. If you are sick: Keep track of your symptoms. If you have an emergency warning sign (including trouble breathing), call 911. Steps to help prevent the spread of COVID-19 if you are sick If you are sick with COVID-19 or think you might have COVID-19, follow the steps below to care for yourself and to help protect other people in your home and community. Stay home except to get medical care Stay home. Most people with COVID-19 have mild illness and can recover at home without medical care. Do not leave your home, except to get medical  care. Do not visit public areas and do not go to places where you are unable to wear a mask. Take care of yourself. Get rest and stay hydrated. Take over-the-counter medicines, such as acetaminophen, to help you feel better. Stay in touch with your doctor. Call before you get medical care. Be sure to get care if you have trouble breathing, or have any other emergency warning signs, or if you think it is an emergency. Avoid public transportation, ride-sharing, or taxis if possible. Get tested If you have symptoms of COVID-19, get tested. While waiting for test results, stay away from others, including staying apart from those living in your household. Get tested as soon as possible after your symptoms start. Treatments may be available for people with COVID-19 who are at risk for becoming very sick. Don't delay: Treatment must be started early to be effective--some treatments must begin within 5 days of your first symptoms. Contact your healthcare provider right away if your test result is positive to determine if you are eligible. Self-tests are one of several options for testing for the virus that causes COVID-19 and may be more convenient than  laboratory-based tests and point-of-care tests. Ask your healthcare provider or your local health department if you need help interpreting your test results. You can visit your state, tribal, local, and territorial health department's website to look for the latest local information on testing sites. Separate yourself from other people As much as possible, stay in a specific room and away from other people and pets in your home. If possible, you should use a separate bathroom. If you need to be around other people or animals in or outside of the home, wear a well-fitting mask. Tell your close contacts that they may have been exposed to COVID-19. An infected person can spread COVID-19 starting 48 hours (or 2 days) before the person has any symptoms or tests positive. By letting your close contacts know they may have been exposed to COVID-19, you are helping to protect everyone. See COVID-19 and Animals if you have questions about pets. If you are diagnosed with COVID-19, someone from the health department may call you. Answer the call to slow the spread. Monitor your symptoms Symptoms of COVID-19 include fever, cough, or other symptoms. Follow care instructions from your healthcare provider and local health department. Your local health authorities may give instructions on checking your symptoms and reporting information. When to seek emergency medical attention Look for emergency warning signs* for COVID-19. If someone is showing any of these signs, seek emergency medical care immediately: Trouble breathing Persistent pain or pressure in the chest New confusion Inability to wake or stay awake Pale, gray, or blue-colored skin, lips, or nail beds, depending on skin tone *This list is not all possible symptoms. Please call your medical provider for any other symptoms that are severe or concerning to you. Call 911 or call ahead to your local emergency facility: Notify the operator that you are seeking  care for someone who has or may have COVID-19. Call ahead before visiting your doctor Call ahead. Many medical visits for routine care are being postponed or done by phone or telemedicine. If you have a medical appointment that cannot be postponed, call your doctor's office, and tell them you have or may have COVID-19. This will help the office protect themselves and other patients. If you are sick, wear a well-fitting mask You should wear a mask if you must be around other people or animals, including pets (  even at home). Wear a mask with the best fit, protection, and comfort for you. You don't need to wear the mask if you are alone. If you can't put on a mask (because of trouble breathing, for example), cover your coughs and sneezes in some other way. Try to stay at least 6 feet away from other people. This will help protect the people around you. Masks should not be placed on young children under age 51 years, anyone who has trouble breathing, or anyone who is not able to remove the mask without help. Cover your coughs and sneezes Cover your mouth and nose with a tissue when you cough or sneeze. Throw away used tissues in a lined trash can. Immediately wash your hands with soap and water for at least 20 seconds. If soap and water are not available, clean your hands with an alcohol-based hand sanitizer that contains at least 60% alcohol. Clean your hands often Wash your hands often with soap and water for at least 20 seconds. This is especially important after blowing your nose, coughing, or sneezing; going to the bathroom; and before eating or preparing food. Use hand sanitizer if soap and water are not available. Use an alcohol-based hand sanitizer with at least 60% alcohol, covering all surfaces of your hands and rubbing them together until they feel dry. Soap and water are the best option, especially if hands are visibly dirty. Avoid touching your eyes, nose, and mouth with unwashed  hands. Handwashing Tips Avoid sharing personal household items Do not share dishes, drinking glasses, cups, eating utensils, towels, or bedding with other people in your home. Wash these items thoroughly after using them with soap and water or put in the dishwasher. Clean surfaces in your home regularly Clean and disinfect high-touch surfaces (for example, doorknobs, tables, handles, light switches, and countertops) in your "sick room" and bathroom. In shared spaces, you should clean and disinfect surfaces and items after each use by the person who is ill. If you are sick and cannot clean, a caregiver or other person should only clean and disinfect the area around you (such as your bedroom and bathroom) on an as needed basis. Your caregiver/other person should wait as long as possible (at least several hours) and wear a mask before entering, cleaning, and disinfecting shared spaces that you use. Clean and disinfect areas that may have blood, stool, or body fluids on them. Use household cleaners and disinfectants. Clean visible dirty surfaces with household cleaners containing soap or detergent. Then, use a household disinfectant. Use a product from [employer] List N: Disinfectants for Coronavirus (COVID-19). Be sure to follow the instructions on the label to ensure safe and effective use of the product. Many products recommend keeping the surface wet with a disinfectant for a certain period of time (look at "contact time" on the product label). You may also need to wear personal protective equipment, such as gloves, depending on the directions on the product label. Immediately after disinfecting, wash your hands with soap and water for 20 seconds. For completed guidance on cleaning and disinfecting your home, visit Complete Disinfection Guidance. Take steps to improve ventilation at home Improve ventilation (air flow) at home to help prevent from spreading COVID-19 to other people in your  household. Clear out COVID-19 virus particles in the air by opening windows, using air filters, and turning on fans in your home. Use this interactive tool to learn how to improve air flow in your home. When you can be around others  after being sick with COVID-19 Deciding when you can be around others is different for different situations. Find out when you can safely end home isolation. For any additional questions about your care, contact your healthcare provider or state or local health department. 06/21/2020 Content source: [facility] for Immunization and Respiratory Diseases ([facility]), Division of Viral Diseases This information is not intended to replace advice given to you by your health care provider. Make sure you discuss any questions you have with your health care provider. Document Revised: 08/04/2020 Document Reviewed: 08/04/2020 Elsevier Patient Education  2022 [employer].     If you have been instructed to have an in-person evaluation today at a local Urgent Care facility, please use the link below. It will take you to a list of all of our available [facility], including address, phone number and hours of operation. Please do not delay care.  [facility]  If you or a family member do not have a primary care provider, use the link below to schedule a visit and establish care. When you choose a [facility] primary care physician or advanced practice provider, you gain a long-term partner in health. Find a Primary Care Provider  Learn more about [facility]'s in-office and virtual care options: [facility] - Get Care Now

## 2022-11-28 ENCOUNTER — Encounter: Payer: Self-pay | Admitting: Family Medicine

## 2022-12-18 DIAGNOSIS — F321 Major depressive disorder, single episode, moderate: Secondary | ICD-10-CM | POA: Diagnosis not present

## 2022-12-18 DIAGNOSIS — Z791 Long term (current) use of non-steroidal anti-inflammatories (NSAID): Secondary | ICD-10-CM | POA: Diagnosis not present

## 2022-12-18 DIAGNOSIS — Z6841 Body Mass Index (BMI) 40.0 and over, adult: Secondary | ICD-10-CM | POA: Diagnosis not present

## 2022-12-18 DIAGNOSIS — Z818 Family history of other mental and behavioral disorders: Secondary | ICD-10-CM | POA: Diagnosis not present

## 2022-12-18 DIAGNOSIS — R7303 Prediabetes: Secondary | ICD-10-CM | POA: Diagnosis not present

## 2022-12-18 DIAGNOSIS — Z87891 Personal history of nicotine dependence: Secondary | ICD-10-CM | POA: Diagnosis not present

## 2022-12-18 DIAGNOSIS — Z8249 Family history of ischemic heart disease and other diseases of the circulatory system: Secondary | ICD-10-CM | POA: Diagnosis not present

## 2022-12-18 DIAGNOSIS — J4520 Mild intermittent asthma, uncomplicated: Secondary | ICD-10-CM | POA: Diagnosis not present

## 2022-12-18 DIAGNOSIS — F411 Generalized anxiety disorder: Secondary | ICD-10-CM | POA: Diagnosis not present

## 2022-12-18 DIAGNOSIS — I10 Essential (primary) hypertension: Secondary | ICD-10-CM | POA: Diagnosis not present

## 2022-12-18 DIAGNOSIS — K219 Gastro-esophageal reflux disease without esophagitis: Secondary | ICD-10-CM | POA: Diagnosis not present

## 2022-12-24 DIAGNOSIS — I10 Essential (primary) hypertension: Secondary | ICD-10-CM | POA: Diagnosis not present

## 2022-12-24 DIAGNOSIS — K219 Gastro-esophageal reflux disease without esophagitis: Secondary | ICD-10-CM | POA: Diagnosis not present

## 2022-12-26 DIAGNOSIS — I10 Essential (primary) hypertension: Secondary | ICD-10-CM | POA: Diagnosis not present

## 2022-12-26 DIAGNOSIS — K219 Gastro-esophageal reflux disease without esophagitis: Secondary | ICD-10-CM | POA: Diagnosis not present

## 2022-12-31 DIAGNOSIS — Z713 Dietary counseling and surveillance: Secondary | ICD-10-CM | POA: Diagnosis not present

## 2023-01-08 DIAGNOSIS — R635 Abnormal weight gain: Secondary | ICD-10-CM | POA: Diagnosis not present

## 2023-01-14 DIAGNOSIS — Z713 Dietary counseling and surveillance: Secondary | ICD-10-CM | POA: Diagnosis not present

## 2023-01-21 DIAGNOSIS — Z01818 Encounter for other preprocedural examination: Secondary | ICD-10-CM | POA: Diagnosis not present

## 2023-01-21 DIAGNOSIS — K219 Gastro-esophageal reflux disease without esophagitis: Secondary | ICD-10-CM | POA: Diagnosis not present

## 2023-01-23 DIAGNOSIS — I10 Essential (primary) hypertension: Secondary | ICD-10-CM | POA: Diagnosis not present

## 2023-01-23 DIAGNOSIS — K219 Gastro-esophageal reflux disease without esophagitis: Secondary | ICD-10-CM | POA: Diagnosis not present

## 2023-01-23 DIAGNOSIS — Z713 Dietary counseling and surveillance: Secondary | ICD-10-CM | POA: Diagnosis not present

## 2023-01-23 DIAGNOSIS — Z6841 Body Mass Index (BMI) 40.0 and over, adult: Secondary | ICD-10-CM | POA: Diagnosis not present

## 2023-01-28 DIAGNOSIS — F411 Generalized anxiety disorder: Secondary | ICD-10-CM | POA: Diagnosis not present

## 2023-01-28 DIAGNOSIS — K219 Gastro-esophageal reflux disease without esophagitis: Secondary | ICD-10-CM | POA: Diagnosis not present

## 2023-02-11 DIAGNOSIS — Z713 Dietary counseling and surveillance: Secondary | ICD-10-CM | POA: Diagnosis not present

## 2023-02-20 DIAGNOSIS — F411 Generalized anxiety disorder: Secondary | ICD-10-CM | POA: Diagnosis not present

## 2023-02-25 DIAGNOSIS — F411 Generalized anxiety disorder: Secondary | ICD-10-CM | POA: Diagnosis not present

## 2023-02-25 DIAGNOSIS — F330 Major depressive disorder, recurrent, mild: Secondary | ICD-10-CM | POA: Diagnosis not present

## 2023-03-04 DIAGNOSIS — K219 Gastro-esophageal reflux disease without esophagitis: Secondary | ICD-10-CM | POA: Diagnosis not present

## 2023-03-04 DIAGNOSIS — Z6841 Body Mass Index (BMI) 40.0 and over, adult: Secondary | ICD-10-CM | POA: Diagnosis not present

## 2023-03-04 DIAGNOSIS — R7303 Prediabetes: Secondary | ICD-10-CM | POA: Diagnosis not present

## 2023-03-04 DIAGNOSIS — I10 Essential (primary) hypertension: Secondary | ICD-10-CM | POA: Diagnosis not present

## 2023-03-04 DIAGNOSIS — Z713 Dietary counseling and surveillance: Secondary | ICD-10-CM | POA: Diagnosis not present

## 2023-03-22 ENCOUNTER — Other Ambulatory Visit: Payer: Self-pay | Admitting: *Deleted

## 2023-03-22 ENCOUNTER — Other Ambulatory Visit: Payer: Self-pay | Admitting: Family Medicine

## 2023-03-22 ENCOUNTER — Encounter: Payer: Self-pay | Admitting: Family Medicine

## 2023-03-22 DIAGNOSIS — F411 Generalized anxiety disorder: Secondary | ICD-10-CM

## 2023-03-22 DIAGNOSIS — Z713 Dietary counseling and surveillance: Secondary | ICD-10-CM | POA: Diagnosis not present

## 2023-03-22 DIAGNOSIS — K219 Gastro-esophageal reflux disease without esophagitis: Secondary | ICD-10-CM

## 2023-03-22 MED ORDER — BUSPIRONE HCL 5 MG PO TABS
5.0000 mg | ORAL_TABLET | Freq: Two times a day (BID) | ORAL | 0 refills | Status: AC
Start: 2023-03-22 — End: ?

## 2023-03-22 MED ORDER — OMEPRAZOLE 40 MG PO CPDR
40.0000 mg | DELAYED_RELEASE_CAPSULE | Freq: Every day | ORAL | 3 refills | Status: AC
Start: 2023-03-22 — End: ?

## 2023-04-01 DIAGNOSIS — Z713 Dietary counseling and surveillance: Secondary | ICD-10-CM | POA: Diagnosis not present

## 2023-04-09 DIAGNOSIS — Z713 Dietary counseling and surveillance: Secondary | ICD-10-CM | POA: Diagnosis not present

## 2023-04-09 DIAGNOSIS — R7303 Prediabetes: Secondary | ICD-10-CM | POA: Diagnosis not present

## 2023-04-09 DIAGNOSIS — K219 Gastro-esophageal reflux disease without esophagitis: Secondary | ICD-10-CM | POA: Diagnosis not present

## 2023-04-09 DIAGNOSIS — I10 Essential (primary) hypertension: Secondary | ICD-10-CM | POA: Diagnosis not present

## 2023-04-09 DIAGNOSIS — Z6841 Body Mass Index (BMI) 40.0 and over, adult: Secondary | ICD-10-CM | POA: Diagnosis not present

## 2023-04-16 DIAGNOSIS — F411 Generalized anxiety disorder: Secondary | ICD-10-CM | POA: Diagnosis not present

## 2023-04-24 DIAGNOSIS — Z713 Dietary counseling and surveillance: Secondary | ICD-10-CM | POA: Diagnosis not present

## 2023-04-30 ENCOUNTER — Telehealth: Payer: 59 | Admitting: Family Medicine

## 2023-04-30 DIAGNOSIS — R6889 Other general symptoms and signs: Secondary | ICD-10-CM | POA: Diagnosis not present

## 2023-04-30 MED ORDER — FLUTICASONE PROPIONATE 50 MCG/ACT NA SUSP: 2.0000 | Freq: Every day | NASAL | 0 refills | Status: AC

## 2023-04-30 MED ORDER — BENZONATATE 100 MG PO CAPS: 100.0000 mg | ORAL_CAPSULE | Freq: Three times a day (TID) | ORAL | 0 refills | Status: AC | PRN

## 2023-04-30 MED ORDER — PROMETHAZINE-DM 6.25-15 MG/5ML PO SYRP
5.0000 mL | ORAL_SOLUTION | Freq: Four times a day (QID) | ORAL | 0 refills | Status: AC | PRN
Start: 2023-04-30 — End: ?

## 2023-04-30 NOTE — Progress Notes (Signed)
Virtual Visit Consent   [name], you are scheduled for a virtual visit with a [facility] [place] provider today. Just as with appointments in the office, your consent must be obtained to participate. Your consent will be active for this visit and any virtual visit you may have with one of our providers in the next 365 days. If you have a MyChart account, a copy of this consent can be sent to you electronically.  As this is a virtual visit, video technology does not allow for your provider to perform a traditional examination. This may limit your provider's ability to fully assess your condition. If your provider identifies any concerns that need to be evaluated in person or the need to arrange testing (such as labs, EKG, etc.), we will make arrangements to do so. Although advances in technology are sophisticated, we cannot ensure that it will always work on either your end or our end. If the connection with a video visit is poor, the visit may have to be switched to a telephone visit. With either a video or telephone visit, we are not always able to ensure that we have a secure connection.  By engaging in this virtual visit, you consent to the provision of healthcare and authorize for your insurance to be billed (if applicable) for the services provided during this visit. Depending on your insurance coverage, you may receive a charge related to this service.  I need to obtain your verbal consent now. Are you willing to proceed with your visit today? Katherine Weeks has provided verbal consent on 04/30/2023 for a virtual visit (video or telephone). [name], NP  Date: 04/30/2023 2:02 PM  Virtual Visit via Video Note   I, [name], connected with  Katherine Weeks  ([id number], 1985-08-01) on 04/30/23 at  2:00 PM EST by a video-enabled telemedicine application and verified that I am speaking with the correct person using two identifiers.  Location: Patient: Virtual Visit Location Patient:  Home Provider: Virtual Visit Location Provider: Home Office   I discussed the limitations of evaluation and management by telemedicine and the availability of in person appointments. The patient expressed understanding and agreed to proceed.    History of Present Illness: Katherine Weeks is a 38 y.o. who identifies as a female who was assigned female at birth, and is being seen today for Flu like symptoms.  Onset was Friday with like a cold, then progressed over weekend with muscle aches, and fever- 101,sweating  Associated symptoms are headaches, ear ache, sore throat, cough, shortness of breath- due to congestion  Modifying factors are thera flu, nyquil, mucinex, and tylenol  Denies chest pain   Exposure to sick contacts- known with + Flu COVID test: no  Vaccines: Not utd   Problems:  Patient Active Problem List   Diagnosis Date Noted   Primary hypertension 12/01/2021   GAD (generalized anxiety disorder) 12/01/2021   Prediabetes 12/01/2021    Allergies:  Allergies  Allergen Reactions   Penicillins Itching   Medications:  Current Outpatient Medications:    albuterol (VENTOLIN HFA) 108 (90 Base) MCG/ACT inhaler, Inhale 2 puffs into the lungs every 6 (six) hours as needed for wheezing or shortness of breath., Disp: 1 each, Rfl: 1   busPIRone (BUSPAR) 5 MG tablet, Take 1 tablet (5 mg total) by mouth 2 (two) times daily., Disp: 180 tablet, Rfl: 0   escitalopram (LEXAPRO) 10 MG tablet, Take 1 tablet (10 mg total) by mouth daily., Disp:  90 tablet, Rfl: 1   losartan (COZAAR) 50 MG tablet, Take 1 tablet (50 mg total) by mouth daily., Disp: 90 tablet, Rfl: 1   omeprazole (PRILOSEC) 40 MG capsule, Take 1 capsule (40 mg total) by mouth daily., Disp: 30 capsule, Rfl: 3   phentermine (ADIPEX-P) 37.5 MG tablet, Take half tablet daily  PO prior to breakfast.After a week, may increase to full tablet in needed, Disp: 30 tablet, Rfl: 2  Observations/Objective: Patient is well-developed,  well-nourished in no acute distress.  Resting comfortably  at home.  Head is normocephalic, atraumatic.  No labored breathing.  Speech is clear and coherent with logical content.  Patient is alert and oriented at baseline.  Congestion tone is noted   Assessment and Plan:  1. Flu-like symptoms (Primary)  - promethazine-dextromethorphan (PROMETHAZINE-DM) 6.25-15 MG/5ML syrup; Take 5 mLs by mouth 4 (four) times daily as needed for cough.  Dispense: 118 mL; Refill: 0 - fluticasone (FLONASE) 50 MCG/ACT nasal spray; Place 2 sprays into both nostrils daily.  Dispense: 16 g; Refill: 0 - benzonatate (TESSALON) 100 MG capsule; Take 1 capsule (100 mg total) by mouth 3 (three) times daily as needed for cough.  Dispense: 30 capsule; Refill: 0  - Continue OTC symptomatic management of choice  - Take prescribed medications as directed - Push fluids - Rest as needed - Discussed return precautions and when to seek in-person evaluation, sent via AVS as well  Reviewed side effects, risks and benefits of medication.    Patient acknowledged agreement and understanding of the plan.   Past Medical, Surgical, Social History, Allergies, and Medications have been Reviewed.    Follow Up Instructions: I discussed the assessment and treatment plan with the patient. The patient was provided an opportunity to ask questions and all were answered. The patient agreed with the plan and demonstrated an understanding of the instructions.  A copy of instructions were sent to the patient via MyChart unless otherwise noted below.    The patient was advised to call back or seek an in-person evaluation if the symptoms worsen or if the condition fails to improve as anticipated.    [name], NP

## 2023-04-30 NOTE — Patient Instructions (Addendum)
Katherine Weeks, thank you for joining Katherine Finner, NP for today's virtual visit.  While this provider is not your primary care provider (PCP), if your PCP is located in our provider database this encounter information will be shared with them immediately following your visit.   A [facility] MyChart account gives you access to today's visit and all your visits, tests, and labs performed at [facility] " click here if you don't have a [facility] MyChart account or go to [email or web address]  Consent: (Patient) Katherine Weeks provided verbal consent for this virtual visit at the beginning of the encounter.  Current Medications:  Current Outpatient Medications:    benzonatate (TESSALON) 100 MG capsule, Take 1 capsule (100 mg total) by mouth 3 (three) times daily as needed for cough., Disp: 30 capsule, Rfl: 0   fluticasone (FLONASE) 50 MCG/ACT nasal spray, Place 2 sprays into both nostrils daily., Disp: 16 g, Rfl: 0   promethazine-dextromethorphan (PROMETHAZINE-DM) 6.25-15 MG/5ML syrup, Take 5 mLs by mouth 4 (four) times daily as needed for cough., Disp: 118 mL, Rfl: 0   albuterol (VENTOLIN HFA) 108 (90 Base) MCG/ACT inhaler, Inhale 2 puffs into the lungs every 6 (six) hours as needed for wheezing or shortness of breath., Disp: 1 each, Rfl: 1   busPIRone (BUSPAR) 5 MG tablet, Take 1 tablet (5 mg total) by mouth 2 (two) times daily., Disp: 180 tablet, Rfl: 0   escitalopram (LEXAPRO) 10 MG tablet, Take 1 tablet (10 mg total) by mouth daily., Disp: 90 tablet, Rfl: 1   losartan (COZAAR) 50 MG tablet, Take 1 tablet (50 mg total) by mouth daily., Disp: 90 tablet, Rfl: 1   omeprazole (PRILOSEC) 40 MG capsule, Take 1 capsule (40 mg total) by mouth daily., Disp: 30 capsule, Rfl: 3   phentermine (ADIPEX-P) 37.5 MG tablet, Take half tablet daily  PO prior to breakfast.After a week, may increase to full tablet in needed, Disp: 30 tablet, Rfl: 2   Medications ordered in this encounter:   Meds ordered this encounter  Medications   promethazine-dextromethorphan (PROMETHAZINE-DM) 6.25-15 MG/5ML syrup    Sig: Take 5 mLs by mouth 4 (four) times daily as needed for cough.    Dispense:  118 mL    Refill:  0    Supervising Provider:   Merrilee Weeks [[id number]]   fluticasone (FLONASE) 50 MCG/ACT nasal spray    Sig: Place 2 sprays into both nostrils daily.    Dispense:  16 g    Refill:  0    Supervising Provider:   Merrilee Weeks [[id number]]   benzonatate (TESSALON) 100 MG capsule    Sig: Take 1 capsule (100 mg total) by mouth 3 (three) times daily as needed for cough.    Dispense:  30 capsule    Refill:  0    Supervising Provider:   Merrilee Weeks [[id number]]     *If you need refills on other medications prior to your next appointment, please contact your pharmacy*  Follow-Up: Call back or seek an in-person evaluation if the symptoms worsen or if the condition fails to improve as anticipated.  [facility] [phone]  Other Instructions  - Continue OTC symptomatic management of choice - Take prescribed medications as directed - Push fluids - Rest as needed - Discussed return precautions and when to seek in-person evaluation    If you have been instructed to have an in-person evaluation today at a local Urgent Care facility, please use the  link below. It will take you to a list of all of our available [facility], including address, phone number and hours of operation. Please do not delay care.  [facility]  If you or a family member do not have a primary care provider, use the link below to schedule a visit and establish care. When you choose a [facility] primary care physician or advanced practice provider, you gain a long-term partner in health. Find a Primary Care Provider  Learn more about [facility]'s in-office and virtual care options: [facility] - Get Care Now

## 2023-05-02 DIAGNOSIS — H6691 Otitis media, unspecified, right ear: Secondary | ICD-10-CM | POA: Diagnosis not present

## 2023-05-02 DIAGNOSIS — R509 Fever, unspecified: Secondary | ICD-10-CM | POA: Diagnosis not present

## 2023-05-02 DIAGNOSIS — Z20828 Contact with and (suspected) exposure to other viral communicable diseases: Secondary | ICD-10-CM | POA: Diagnosis not present

## 2023-05-02 DIAGNOSIS — R519 Headache, unspecified: Secondary | ICD-10-CM | POA: Diagnosis not present

## 2023-05-15 DIAGNOSIS — Z713 Dietary counseling and surveillance: Secondary | ICD-10-CM | POA: Diagnosis not present

## 2023-05-31 DIAGNOSIS — Z713 Dietary counseling and surveillance: Secondary | ICD-10-CM | POA: Diagnosis not present

## 2023-06-05 ENCOUNTER — Encounter: Payer: Self-pay | Admitting: Family Medicine

## 2023-06-10 ENCOUNTER — Encounter: Payer: Self-pay | Admitting: Family Medicine

## 2023-07-08 DIAGNOSIS — Z713 Dietary counseling and surveillance: Secondary | ICD-10-CM | POA: Diagnosis not present

## 2023-07-08 DIAGNOSIS — E669 Obesity, unspecified: Secondary | ICD-10-CM | POA: Diagnosis not present

## 2023-07-09 DIAGNOSIS — L02412 Cutaneous abscess of left axilla: Secondary | ICD-10-CM | POA: Diagnosis not present

## 2023-07-09 DIAGNOSIS — K219 Gastro-esophageal reflux disease without esophagitis: Secondary | ICD-10-CM | POA: Diagnosis not present

## 2023-07-09 DIAGNOSIS — Z01818 Encounter for other preprocedural examination: Secondary | ICD-10-CM | POA: Diagnosis not present

## 2023-07-09 DIAGNOSIS — R7303 Prediabetes: Secondary | ICD-10-CM | POA: Diagnosis not present

## 2023-07-09 DIAGNOSIS — Z713 Dietary counseling and surveillance: Secondary | ICD-10-CM | POA: Diagnosis not present

## 2023-07-09 DIAGNOSIS — Z6841 Body Mass Index (BMI) 40.0 and over, adult: Secondary | ICD-10-CM | POA: Diagnosis not present

## 2023-07-09 DIAGNOSIS — I10 Essential (primary) hypertension: Secondary | ICD-10-CM | POA: Diagnosis not present

## 2023-07-30 DIAGNOSIS — I10 Essential (primary) hypertension: Secondary | ICD-10-CM | POA: Diagnosis not present

## 2023-07-30 DIAGNOSIS — K219 Gastro-esophageal reflux disease without esophagitis: Secondary | ICD-10-CM | POA: Diagnosis not present

## 2023-07-30 DIAGNOSIS — R7303 Prediabetes: Secondary | ICD-10-CM | POA: Diagnosis not present

## 2023-07-30 DIAGNOSIS — Z6841 Body Mass Index (BMI) 40.0 and over, adult: Secondary | ICD-10-CM | POA: Diagnosis not present

## 2023-08-06 ENCOUNTER — Encounter: Payer: Self-pay | Admitting: General Surgery

## 2023-08-14 DIAGNOSIS — F411 Generalized anxiety disorder: Secondary | ICD-10-CM | POA: Diagnosis not present

## 2023-08-27 DIAGNOSIS — Z713 Dietary counseling and surveillance: Secondary | ICD-10-CM | POA: Diagnosis not present

## 2023-08-27 DIAGNOSIS — E569 Vitamin deficiency, unspecified: Secondary | ICD-10-CM | POA: Diagnosis not present

## 2023-08-29 ENCOUNTER — Telehealth

## 2023-08-29 DIAGNOSIS — J069 Acute upper respiratory infection, unspecified: Secondary | ICD-10-CM | POA: Diagnosis not present

## 2023-09-02 DIAGNOSIS — Z9884 Bariatric surgery status: Secondary | ICD-10-CM | POA: Diagnosis not present

## 2023-09-02 DIAGNOSIS — Z713 Dietary counseling and surveillance: Secondary | ICD-10-CM | POA: Diagnosis not present

## 2023-09-02 DIAGNOSIS — R051 Acute cough: Secondary | ICD-10-CM | POA: Diagnosis not present

## 2023-09-02 DIAGNOSIS — Z6841 Body Mass Index (BMI) 40.0 and over, adult: Secondary | ICD-10-CM | POA: Diagnosis not present

## 2023-09-02 DIAGNOSIS — J069 Acute upper respiratory infection, unspecified: Secondary | ICD-10-CM | POA: Diagnosis not present

## 2023-09-02 DIAGNOSIS — J208 Acute bronchitis due to other specified organisms: Secondary | ICD-10-CM | POA: Diagnosis not present

## 2023-09-27 DIAGNOSIS — K311 Adult hypertrophic pyloric stenosis: Secondary | ICD-10-CM | POA: Diagnosis not present

## 2023-09-27 DIAGNOSIS — R110 Nausea: Secondary | ICD-10-CM | POA: Diagnosis not present

## 2023-09-27 DIAGNOSIS — Z9884 Bariatric surgery status: Secondary | ICD-10-CM | POA: Diagnosis not present

## 2023-09-27 DIAGNOSIS — K2950 Unspecified chronic gastritis without bleeding: Secondary | ICD-10-CM | POA: Diagnosis not present

## 2023-09-27 DIAGNOSIS — K289 Gastrojejunal ulcer, unspecified as acute or chronic, without hemorrhage or perforation: Secondary | ICD-10-CM | POA: Diagnosis not present

## 2023-09-27 DIAGNOSIS — R1310 Dysphagia, unspecified: Secondary | ICD-10-CM | POA: Diagnosis not present

## 2023-10-07 ENCOUNTER — Encounter: Payer: Self-pay | Admitting: Family Medicine

## 2023-10-23 DIAGNOSIS — F411 Generalized anxiety disorder: Secondary | ICD-10-CM | POA: Diagnosis not present

## 2023-11-04 DIAGNOSIS — E569 Vitamin deficiency, unspecified: Secondary | ICD-10-CM | POA: Diagnosis not present

## 2023-12-06 DIAGNOSIS — K289 Gastrojejunal ulcer, unspecified as acute or chronic, without hemorrhage or perforation: Secondary | ICD-10-CM | POA: Diagnosis not present

## 2023-12-06 DIAGNOSIS — Z9884 Bariatric surgery status: Secondary | ICD-10-CM | POA: Diagnosis not present

## 2023-12-06 DIAGNOSIS — R110 Nausea: Secondary | ICD-10-CM | POA: Diagnosis not present

## 2024-02-10 DIAGNOSIS — Z9884 Bariatric surgery status: Secondary | ICD-10-CM | POA: Diagnosis not present

## 2024-02-10 DIAGNOSIS — E569 Vitamin deficiency, unspecified: Secondary | ICD-10-CM | POA: Diagnosis not present

## 2024-02-10 DIAGNOSIS — E559 Vitamin D deficiency, unspecified: Secondary | ICD-10-CM | POA: Diagnosis not present

## 2024-02-10 DIAGNOSIS — D509 Iron deficiency anemia, unspecified: Secondary | ICD-10-CM | POA: Diagnosis not present

## 2024-02-10 DIAGNOSIS — E66812 Obesity, class 2: Secondary | ICD-10-CM | POA: Diagnosis not present
